# Patient Record
Sex: Female | Born: 1970 | Race: White | Hispanic: No | Marital: Single | Smoking: Never smoker
Health system: Southern US, Community
[De-identification: ages and names within clinical notes are randomized; demographics above are authoritative.]

## PROBLEM LIST (undated history)

## (undated) DIAGNOSIS — F411 Generalized anxiety disorder: Secondary | ICD-10-CM

## (undated) DIAGNOSIS — M1990 Unspecified osteoarthritis, unspecified site: Secondary | ICD-10-CM

## (undated) DIAGNOSIS — L0390 Cellulitis, unspecified: Secondary | ICD-10-CM

## (undated) DIAGNOSIS — T691XXA Chilblains, initial encounter: Secondary | ICD-10-CM

## (undated) DIAGNOSIS — F329 Major depressive disorder, single episode, unspecified: Secondary | ICD-10-CM

## (undated) DIAGNOSIS — F32A Depression, unspecified: Secondary | ICD-10-CM

## (undated) HISTORY — PX: AUGMENTATION MAMMAPLASTY: SUR837

## (undated) HISTORY — PX: TONSILECTOMY, ADENOIDECTOMY, BILATERAL MYRINGOTOMY AND TUBES: SHX2538

## (undated) HISTORY — DX: Generalized anxiety disorder: F41.1

## (undated) HISTORY — PX: COSMETIC SURGERY: SHX468

## (undated) HISTORY — DX: Cellulitis, unspecified: L03.90

## (undated) HISTORY — DX: Chilblains, initial encounter: T69.1XXA

## (undated) HISTORY — DX: Depression, unspecified: F32.A

## (undated) HISTORY — DX: Unspecified osteoarthritis, unspecified site: M19.90

## (undated) HISTORY — DX: Major depressive disorder, single episode, unspecified: F32.9

---

## 2000-05-16 ENCOUNTER — Ambulatory Visit (HOSPITAL_COMMUNITY): Admission: RE | Admit: 2000-05-16 | Discharge: 2000-05-16 | Payer: Self-pay

## 2000-07-01 HISTORY — PX: BREAST ENHANCEMENT SURGERY: SHX7

## 2006-03-07 ENCOUNTER — Ambulatory Visit: Payer: Self-pay | Admitting: Internal Medicine

## 2006-09-25 ENCOUNTER — Ambulatory Visit: Payer: Self-pay | Admitting: Chiropractor

## 2006-10-31 ENCOUNTER — Ambulatory Visit: Payer: Self-pay | Admitting: Chiropractor

## 2008-04-13 ENCOUNTER — Ambulatory Visit: Payer: Self-pay | Admitting: Chiropractic Medicine

## 2008-05-06 ENCOUNTER — Ambulatory Visit: Payer: Self-pay | Admitting: Family Medicine

## 2008-05-06 DIAGNOSIS — M25559 Pain in unspecified hip: Secondary | ICD-10-CM | POA: Insufficient documentation

## 2008-05-06 DIAGNOSIS — M779 Enthesopathy, unspecified: Secondary | ICD-10-CM | POA: Insufficient documentation

## 2008-06-10 ENCOUNTER — Ambulatory Visit: Payer: Self-pay | Admitting: Family Medicine

## 2008-07-04 ENCOUNTER — Ambulatory Visit: Payer: Self-pay | Admitting: Internal Medicine

## 2008-08-05 ENCOUNTER — Ambulatory Visit: Payer: Self-pay | Admitting: Unknown Physician Specialty

## 2008-09-29 ENCOUNTER — Ambulatory Visit: Payer: Self-pay | Admitting: Internal Medicine

## 2009-01-29 ENCOUNTER — Ambulatory Visit: Payer: Self-pay | Admitting: Internal Medicine

## 2009-02-24 ENCOUNTER — Ambulatory Visit: Payer: Self-pay | Admitting: Internal Medicine

## 2009-03-01 ENCOUNTER — Ambulatory Visit: Payer: Self-pay | Admitting: Internal Medicine

## 2009-03-02 ENCOUNTER — Ambulatory Visit: Payer: Self-pay | Admitting: Sports Medicine

## 2009-03-31 ENCOUNTER — Ambulatory Visit: Payer: Self-pay | Admitting: Internal Medicine

## 2009-05-01 ENCOUNTER — Ambulatory Visit: Payer: Self-pay | Admitting: Internal Medicine

## 2009-12-27 ENCOUNTER — Ambulatory Visit: Payer: Self-pay | Admitting: Unknown Physician Specialty

## 2010-03-16 ENCOUNTER — Ambulatory Visit: Payer: Self-pay | Admitting: Unknown Physician Specialty

## 2010-03-19 LAB — PATHOLOGY REPORT

## 2010-07-01 LAB — HM COLONOSCOPY

## 2010-09-25 ENCOUNTER — Encounter: Payer: Self-pay | Admitting: Cardiothoracic Surgery

## 2010-09-30 ENCOUNTER — Encounter: Payer: Self-pay | Admitting: Nurse Practitioner

## 2010-09-30 ENCOUNTER — Encounter: Payer: Self-pay | Admitting: Cardiothoracic Surgery

## 2010-10-30 ENCOUNTER — Encounter: Payer: Self-pay | Admitting: Nurse Practitioner

## 2010-10-30 ENCOUNTER — Encounter: Payer: Self-pay | Admitting: Cardiothoracic Surgery

## 2010-11-30 ENCOUNTER — Encounter: Payer: Self-pay | Admitting: Cardiothoracic Surgery

## 2010-11-30 ENCOUNTER — Encounter: Payer: Self-pay | Admitting: Nurse Practitioner

## 2010-12-31 ENCOUNTER — Encounter: Payer: Self-pay | Admitting: Infectious Diseases

## 2010-12-31 ENCOUNTER — Ambulatory Visit (INDEPENDENT_AMBULATORY_CARE_PROVIDER_SITE_OTHER): Payer: PRIVATE HEALTH INSURANCE | Admitting: Infectious Diseases

## 2010-12-31 VITALS — BP 131/86 | HR 89 | Temp 98.4°F | Ht 66.0 in | Wt 135.0 lb

## 2010-12-31 DIAGNOSIS — L0390 Cellulitis, unspecified: Secondary | ICD-10-CM

## 2010-12-31 DIAGNOSIS — L0291 Cutaneous abscess, unspecified: Secondary | ICD-10-CM

## 2010-12-31 MED ORDER — DOXYCYCLINE HYCLATE 100 MG PO TABS: 100.0000 mg | ORAL_TABLET | Freq: Two times a day (BID) | ORAL | Status: AC

## 2010-12-31 NOTE — Assessment & Plan Note (Addendum)
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 Will get a MRI to further eval this. Will start her on doxy at this point and see what her images show. 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 Will have her seen by another rheumatologist (she expresses conflict with seeing Dr Kellie Simmering). Will defer ordering ANA, ANCA, ACE until they see her.  Will see her back in 2 weeks.

## 2010-12-31 NOTE — Progress Notes (Signed)
  Subjective:    Patient ID: Mary Mason, female    DOB: 1971/03/22, 40 y.o.   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 She had previous cortisone shot in her R foot November 2011. This progressed and she developed a deepening hole in her foot. She was seen at wound care center in March [facility]) and was in a boot for sometime. She then developed worsening pain and had a punch done (caused wound to increase in size). She was told she had staph in this bx and she was given cipro for 10 days. This did not improve and she received 10 more days. She was seen at [facility] (~2 weeks ago) and was told that there was no bone infection (plain x-ray). She had ABI nl and dopplers of her leg that were negative as well.  She had a wound/swab Cx then and she followed up with podiatry. She was then changed to amoxil then for 7 days (which she will complete tomorrow) and was told she had a staph infection as well.  She has alos been w/u by derm/immuno at [facility] and was told that her auto-immune tests were negative.  She has a purplish discoloration in R foot and swelling, this worsens with leaving her foot dependent.  Has also developed 6 fingernails which have come off her nail beds.  No fevers or chills (ever during this process). No increase in heat in her foot (more likely to be cold). Has minimal yellowish d/c from her wound. No proximal erythema extending up her leg and calf.  Last night she also noted erythema in her L leg as well.    Review of Systems  Constitutional: Negative for fever and chills.  Hematological: Negative for adenopathy.  She has no hx of trauma to her foot (bite, puncture, ect)     Objective:   Physical Exam  Constitutional: She appears well-developed and well-nourished.  Eyes: EOM are normal. Pupils are equal, round, and reactive to light.  Neck: Neck supple.  Cardiovascular: Normal rate, regular rhythm and normal heart sounds.   Pulses:  Dorsalis pedis pulses are 3+ on the right side.       Posterior tibial pulses are 3+ on the right side.  Pulmonary/Chest: Effort normal and breath sounds normal.  Abdominal: Soft. Bowel sounds are normal. She exhibits no distension. There is no tenderness.  Lymphadenopathy:    She has no cervical adenopathy.  Skin:             Assessment & Plan:

## 2011-01-04 NOTE — Progress Notes (Signed)
Addended by: Jennet Maduro D on: 01/04/2011 04:41 PM   Modules accepted: Orders

## 2011-01-07 ENCOUNTER — Inpatient Hospital Stay (HOSPITAL_COMMUNITY): Admission: RE | Admit: 2011-01-07 | Payer: PRIVATE HEALTH INSURANCE | Source: Ambulatory Visit

## 2011-01-07 ENCOUNTER — Ambulatory Visit (HOSPITAL_COMMUNITY)
Admission: RE | Admit: 2011-01-07 | Discharge: 2011-01-07 | Disposition: A | Discharge status: Home / Self Care | Payer: PRIVATE HEALTH INSURANCE | Source: Ambulatory Visit | Attending: Infectious Diseases | Admitting: Infectious Diseases

## 2011-01-07 DIAGNOSIS — L02619 Cutaneous abscess of unspecified foot: Secondary | ICD-10-CM | POA: Insufficient documentation

## 2011-01-07 DIAGNOSIS — L0390 Cellulitis, unspecified: Secondary | ICD-10-CM

## 2011-01-07 DIAGNOSIS — R9389 Abnormal findings on diagnostic imaging of other specified body structures: Secondary | ICD-10-CM | POA: Insufficient documentation

## 2011-01-07 MED ORDER — GADOBENATE DIMEGLUMINE 529 MG/ML IV SOLN
13.0000 mL | Freq: Once | INTRAVENOUS | Status: AC
  Administered 2011-01-07: 13 mL via INTRAVENOUS

## 2011-01-08 ENCOUNTER — Telehealth: Payer: Self-pay | Admitting: *Deleted

## 2011-01-08 NOTE — Telephone Encounter (Signed)
Patient calling regarding MRI results.  Told her Dr. Ninetta Lights will look at tomorrow and I will call her back in the afternoon. Wendall Mola CMA

## 2011-01-10 NOTE — Telephone Encounter (Signed)
Per Dr. Ninetta Lights he will call patient about MRI results. Wendall Mola CMA

## 2011-05-15 DIAGNOSIS — T691XXA Chilblains, initial encounter: Secondary | ICD-10-CM | POA: Insufficient documentation

## 2011-06-11 DIAGNOSIS — R5383 Other fatigue: Secondary | ICD-10-CM | POA: Insufficient documentation

## 2011-06-11 DIAGNOSIS — Z87440 Personal history of urinary (tract) infections: Secondary | ICD-10-CM | POA: Insufficient documentation

## 2011-06-14 DIAGNOSIS — N939 Abnormal uterine and vaginal bleeding, unspecified: Secondary | ICD-10-CM | POA: Insufficient documentation

## 2012-02-13 DIAGNOSIS — B9689 Other specified bacterial agents as the cause of diseases classified elsewhere: Secondary | ICD-10-CM | POA: Insufficient documentation

## 2012-02-13 DIAGNOSIS — N760 Acute vaginitis: Secondary | ICD-10-CM | POA: Insufficient documentation

## 2012-06-16 DIAGNOSIS — R87619 Unspecified abnormal cytological findings in specimens from cervix uteri: Secondary | ICD-10-CM | POA: Insufficient documentation

## 2012-06-16 DIAGNOSIS — Z9882 Breast implant status: Secondary | ICD-10-CM | POA: Insufficient documentation

## 2012-07-18 ENCOUNTER — Emergency Department: Payer: Self-pay | Admitting: Emergency Medicine

## 2014-01-10 ENCOUNTER — Encounter: Payer: Self-pay | Admitting: Sports Medicine

## 2014-01-10 ENCOUNTER — Ambulatory Visit (INDEPENDENT_AMBULATORY_CARE_PROVIDER_SITE_OTHER): Payer: BC Managed Care – PPO | Admitting: Sports Medicine

## 2014-01-10 VITALS — BP 130/83 | HR 76 | Ht 66.0 in | Wt 135.0 lb

## 2014-01-10 DIAGNOSIS — S76311A Strain of muscle, fascia and tendon of the posterior muscle group at thigh level, right thigh, initial encounter: Secondary | ICD-10-CM

## 2014-01-10 DIAGNOSIS — IMO0002 Reserved for concepts with insufficient information to code with codable children: Secondary | ICD-10-CM

## 2014-01-10 NOTE — Progress Notes (Signed)
   Subjective:    Patient ID: [name], female    DOB: [birth year], 43 y.o.   MRN: [id number]  HPI chief complaint: Right hamstring pain  Very pleasant 43 year old female comes in today complaining of several years of intermittent right hamstring pain. She initially began to experience pain back in 2010. Pain was localized to her hamstring at that time. She does not recall a specific injury. She was treated with some home exercises which helped temporarily. Her discomfort is present primarily with running, although she does state that she does have times where she is able to run relatively pain-free. She describes the discomfort as a "tightening" in her hamstring. It is present with hamstring stretching. She most recently began to develop some lateral hip pain and some low back pain as well. She has had some chiropractic treatment and some physical therapy but nothing seems to help. Despite her discomfort she is able to remain active. She denies any deep-seated groin pain. No numbness or tingling. No pain past the knee but pain will occasionally radiate up into the buttock and low back.. No imaging to date. No prior low back or hip surgeries. She has custom orthotics which have been designed by a podiatrist in [place].    Review of Systems    as above Objective:   Physical Exam Well-developed, fit-appearing. No acute distress. Awake alert and oriented x3. Vital signs reviewed   Good lumbar range of motion although she does have reproducible right hamstring pain with forward flexion. No tenderness to palpation or percussion along the lumbar midline. There is tenderness to palpation just posterior to the greater trochanter on the right and a mildly positive Trendelenburg. No tenderness at the sciatic notch. Smooth painless hip range of motion with a negative log roll. Negative straight leg raise. There is no palpable defect or soft tissue swelling in the hamstring muscles. Neg FABER, Neg  FADIR, negative Thomas test. No leg length discrepancy noted. Neurovascularly intact distally.  Patient is an extreme supinator when running. Flexible pes cavus foot.       Assessment & Plan:  Chronic right hamstring strain Gluteus medius weakness Supination  This is a challenging case. I'm going to start with giving her some gluteus medius strengthening exercises and some hamstring eccentric exercises. I examined her custom orthotics today and although they are comfortable and well cushioned, I do not think they provide enough arch support and they definitely do not correct her supination. I discussed the possibility of semirigid orthotics in the future. I've asked her to followup with me in a few weeks. If no initial improvement with her home exercises I may consider plain x-rays of her low back and pelvis. I think she is okay to continue with all activity in the interim including running.  Total time spent with the patient today was 30 minutes with 100% of the time in face-to-face consultation.

## 2014-02-23 ENCOUNTER — Ambulatory Visit: Payer: BC Managed Care – PPO | Admitting: Sports Medicine

## 2014-03-14 ENCOUNTER — Ambulatory Visit (INDEPENDENT_AMBULATORY_CARE_PROVIDER_SITE_OTHER): Payer: BC Managed Care – PPO | Admitting: Sports Medicine

## 2014-03-14 ENCOUNTER — Encounter: Payer: Self-pay | Admitting: Sports Medicine

## 2014-03-14 VITALS — BP 126/81 | HR 72 | Ht 66.0 in | Wt 137.0 lb

## 2014-03-14 DIAGNOSIS — S76311D Strain of muscle, fascia and tendon of the posterior muscle group at thigh level, right thigh, subsequent encounter: Secondary | ICD-10-CM

## 2014-03-14 DIAGNOSIS — M25559 Pain in unspecified hip: Secondary | ICD-10-CM

## 2014-03-14 DIAGNOSIS — Z5189 Encounter for other specified aftercare: Secondary | ICD-10-CM | POA: Diagnosis not present

## 2014-03-14 DIAGNOSIS — IMO0002 Reserved for concepts with insufficient information to code with codable children: Secondary | ICD-10-CM | POA: Diagnosis not present

## 2014-03-14 DIAGNOSIS — S76311A Strain of muscle, fascia and tendon of the posterior muscle group at thigh level, right thigh, initial encounter: Secondary | ICD-10-CM

## 2014-03-15 NOTE — Progress Notes (Signed)
   Subjective:    Patient ID: Mary Mason, female    DOB: 04-19-1971, 43 y.o.   MRN: [id number]  HPI Patient comes in today for followup on chronic right hamstring pain. She has been doing her home exercises for her gluteus medius but admits that she has been a little less faithful about doing her hamstring exercises. She continues to complain of a tightness in her right hamstring with running. Pain would not radiate past the knee. No associated numbness or tingling. No groin pain. Symptoms have been present now for several years. She denies any low back pain.    Review of Systems     Objective:   Physical Exam Well-developed, well-nourished. No acute distress. Lumbar spine demonstrates full painless range of motion. She has excellent hip strength including improved hip abduction strength. Negative straight leg raise bilaterally. There is some slight tenderness to palpation in the mid substance of the hamstring muscles but no palpable defect. No tenderness to palpation at the ischial tuberosity. Neurovascular she is intact distally. Flexible pes cavus foot. She supinates when walking and running.       Assessment & Plan:  Chronic right hamstring pain Supination  As noted previously, this is a rather challenging case. I had previously discussed the possibility of lumbar spine x-rays if her symptoms persisted but I really do not believe her symptoms are originating from her lumbar spine. We're going to try replacing her old orthotics with a pair of green sports insoles with scaphoid pads and lateral heel wedges in order to correct her supination. I'm not sure if that will make much of a difference but I'll have her followup with my partner Dr. Oneida Alar in about 4 weeks for his input. I suspect there is something subtle in her running form that his leg to this chronic right hamstring discomfort.

## 2014-04-08 ENCOUNTER — Encounter: Payer: Self-pay | Admitting: Podiatry

## 2014-04-08 ENCOUNTER — Ambulatory Visit (INDEPENDENT_AMBULATORY_CARE_PROVIDER_SITE_OTHER): Payer: BC Managed Care – PPO | Admitting: Podiatry

## 2014-04-08 VITALS — BP 117/69 | HR 69 | Resp 16 | Ht 66.0 in | Wt 138.0 lb

## 2014-04-08 DIAGNOSIS — L600 Ingrowing nail: Secondary | ICD-10-CM

## 2014-04-08 DIAGNOSIS — M79609 Pain in unspecified limb: Secondary | ICD-10-CM

## 2014-04-08 NOTE — Progress Notes (Signed)
Subjective:     Patient ID: Mary Mason, female   DOB: 10/26/1970, 43 y.o.   MRN: [id number]  HPI patient states I developed an ingrown toenail on my right big toenail that makes it hard for me to wear shoe gear comfortably. I've not noticed any drainage   Review of Systems     Objective:   Physical Exam Neurovascular status intact with an incurvated right hallux lateral border that's painful when palpated    Assessment:     Probable ingrown toenail right hallux lateral    Plan:     Reviewed ingrown toenail and discussed treatment options. Patient wants this corrected and I think would be in her best option. I explained risk of procedure and today I infiltrated the right hallux 60 mg Xylocaine Marcaine mixture remove the lateral border exposed matrix and applied phenol 3 applications 30 seconds followed by alcohol lavaged and sterile dressing. Gave instructions on soaks and reappoint

## 2014-04-08 NOTE — Patient Instructions (Signed)

## 2014-04-19 ENCOUNTER — Ambulatory Visit (INDEPENDENT_AMBULATORY_CARE_PROVIDER_SITE_OTHER): Payer: BC Managed Care – PPO | Admitting: Podiatry

## 2014-04-19 ENCOUNTER — Encounter: Payer: Self-pay | Admitting: Podiatry

## 2014-04-19 ENCOUNTER — Ambulatory Visit: Payer: BC Managed Care – PPO | Admitting: Sports Medicine

## 2014-04-19 VITALS — BP 116/69 | HR 69 | Resp 16

## 2014-04-19 DIAGNOSIS — L03031 Cellulitis of right toe: Secondary | ICD-10-CM

## 2014-04-19 MED ORDER — CEPHALEXIN 500 MG PO CAPS: 500.0000 mg | ORAL_CAPSULE | Freq: Three times a day (TID) | ORAL | Status: DC

## 2014-04-19 NOTE — Progress Notes (Signed)
Subjective:     Patient ID: Mary Mason, female   DOB: [birth year], 43 y.o.   MRN: [id number]  HPI patient presents stating I have some discomfort in the border of my big toenail and I wanted to make sure it doesn't get infected   Review of Systems     Objective:   Physical Exam Neurovascular status intact with no changes in health history. Patient is noted to have mild erythema in the lateral aspect right hallux nail border that's localized with crusted nail formation and no proximal edema erythema or drainage noted    Assessment:     Localized paronychia right hallux    Plan:     I went ahead today and placed her on cephalexin 500 mg 3 times a day and gave instructions of any increase in redness drainage or pain should occur we will need to go in and opened up the border of the nailbed. Continue soaks and reappoint as needed

## 2015-02-06 ENCOUNTER — Encounter (INDEPENDENT_AMBULATORY_CARE_PROVIDER_SITE_OTHER): Payer: Self-pay

## 2015-02-06 ENCOUNTER — Ambulatory Visit (INDEPENDENT_AMBULATORY_CARE_PROVIDER_SITE_OTHER): Payer: BLUE CROSS/BLUE SHIELD | Admitting: Sports Medicine

## 2015-02-06 ENCOUNTER — Encounter: Payer: Self-pay | Admitting: Sports Medicine

## 2015-02-06 VITALS — BP 119/80 | HR 62 | Ht 66.0 in | Wt 140.0 lb

## 2015-02-06 DIAGNOSIS — M216X9 Other acquired deformities of unspecified foot: Secondary | ICD-10-CM | POA: Diagnosis not present

## 2015-02-06 NOTE — Progress Notes (Signed)
   Subjective:    Patient ID: Mary Mason, female    DOB: [birth year], 44 y.o.   MRN: [id number]  HPI  Patient comes in today for custom orthotics. She was last seen in the office back in September of last year. We had discussed making custom orthotics at that time. She has an old pair of orthotics that were constructed at [facility] and she has found them to be comfortable but they are rather expensive and it is inconvenient for her to return to [facility] to have a new pair made. At the time of her last office visit I placed her in green sports insoles with scaphoid pads and a lateral heel wedge to help correct supination. She found these to be uncomfortable and has since stopped using them. She would like for [name] to construct her new custom orthotics today.  Interim medical history reviewed Medications reviewed Allergies reviewed    Review of Systems    as above Objective:   Physical Exam Well-developed, fit appearing. No acute distress. Awake alert and oriented 3. Vital signs reviewed.  Flexible cavus foot. No tenderness to palpation. Patient does supinate with running. Runs without a limp.       Assessment & Plan:  Cavus foot  I inspected her old custom orthotics from [facility]. They are a simple cushioned orthotic. Therefore I will construct her a basic orthotic today. I will not try to post it or correct her supination since she did not get much relief from lateral heel wedges. If she finds the orthotics to be uncomfortable she may return at any time to have them adjusted. Otherwise I'll see her back as needed.  Total time spent with the patient was 30 minutes with greater than 50% of the time spent in face-to-face consultation discussing orthotic construction, instruction, and fitting.  Patient was fitted for a : standard, cushioned, semi-rigid orthotic. The orthotic was heated and afterward the patient stood on the orthotic blank positioned on the orthotic stand. The patient was  positioned in subtalar neutral position and 10 degrees of ankle dorsiflexion in a weight bearing stance. After completion of molding, a stable base was applied to the orthotic blank. The blank was ground to a stable position for weight bearing. Size: 6 Base: blue EVA Posting: none Additional orthotic padding: none

## 2015-10-11 ENCOUNTER — Ambulatory Visit: Payer: BLUE CROSS/BLUE SHIELD | Admitting: Nurse Practitioner

## 2015-10-23 ENCOUNTER — Encounter: Payer: Self-pay | Admitting: Nurse Practitioner

## 2015-10-23 ENCOUNTER — Ambulatory Visit (INDEPENDENT_AMBULATORY_CARE_PROVIDER_SITE_OTHER): Payer: BLUE CROSS/BLUE SHIELD | Admitting: Nurse Practitioner

## 2015-10-23 VITALS — BP 96/62 | HR 58 | Temp 98.2°F | Resp 14 | Ht 66.0 in | Wt 137.2 lb

## 2015-10-23 DIAGNOSIS — M359 Systemic involvement of connective tissue, unspecified: Secondary | ICD-10-CM | POA: Diagnosis not present

## 2015-10-23 DIAGNOSIS — M2550 Pain in unspecified joint: Secondary | ICD-10-CM | POA: Diagnosis not present

## 2015-10-23 DIAGNOSIS — D8989 Other specified disorders involving the immune mechanism, not elsewhere classified: Secondary | ICD-10-CM

## 2015-10-23 DIAGNOSIS — T691XXD Chilblains, subsequent encounter: Secondary | ICD-10-CM

## 2015-10-23 DIAGNOSIS — Z0000 Encounter for general adult medical examination without abnormal findings: Secondary | ICD-10-CM | POA: Diagnosis not present

## 2015-10-23 DIAGNOSIS — Z0001 Encounter for general adult medical examination with abnormal findings: Secondary | ICD-10-CM | POA: Insufficient documentation

## 2015-10-23 NOTE — Progress Notes (Signed)
Patient ID: Mary Mason, female    DOB: May 09, 1971  Age: 45 y.o. MRN: [id number]  CC: Establish Care   HPI SONATA HOCKLEY presents for establishing care and no CC. Will do Annual Physical since there is no need for PAP nor Breast exam- sees GYN.   1) New Pt Info:   Immunizations- Unknown   Mammogram- Implants, 2 yrs ago, getting one soon  Pap- Has an appointment w/ Dr. 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 Vira Agar for GI  Dr. Jerene Pitch for Rheumatology- [facility]  [place] Dr. Dellis Filbert for GYN Dr. Paulla Dolly- Podiatrist Dr. Miche[facility] Dr. Johnnye Sima- Infectious disease due to cellulitis  [facility]    History [name] has a past medical history of Chilblains; Depression; and Arthritis.   She has past surgical history that includes Tonsilectomy, adenoidectomy, bilateral myringotomy and tubes and Breast enhancement surgery (2002).   Her family history includes Cancer in her maternal grandmother; Cancer (age of onset: 59) in her maternal aunt; Hearing loss in her maternal grandmother; Thyroid nodules in her mother.She reports that she has never smoked. She has never used smokeless tobacco. She reports that she drinks alcohol. She reports that she does not use illicit drugs.  Outpatient Prescriptions Prior to Visit  Medication Sig Dispense Refill  . aspirin 81 MG chewable tablet Chew by mouth.    . Multiple Vitamins-Minerals (MULTIVITAMIN WITH MINERALS) tablet Take 1 tablet by mouth daily.      . sertraline (ZOLOFT) 100 MG tablet     . Ascorbic Acid (VITAMIN C) 100 MG tablet Take 100 mg by mouth daily. Reported on 10/23/2015    . Levonorgestrel-Ethinyl Estradiol (SEASONIQUE) 0.15-0.03 &0.01 MG tablet Take by mouth.    Marland Kitchen  omeprazole (PRILOSEC) 20 MG capsule Take 20 mg by mouth daily. Reported on 10/23/2015    . cephALEXin (KEFLEX) 500 MG capsule Take 1 capsule (500 mg total) by mouth 3 (three) times daily. (Patient not taking: Reported on 02/06/2015) 30 capsule 1  . clopidogrel (PLAVIX) 75 MG tablet Take by mouth.    . hydroxychloroquine (PLAQUENIL) 200 MG tablet Take by mouth.    . prednisoLONE acetate (PRED FORTE) 1 % ophthalmic suspension instill 2 drops into both eyes four times a day  0   No facility-administered medications prior to visit.    ROS Review of Systems  Constitutional: Negative for fever, chills, diaphoresis, fatigue and unexpected weight change.  HENT: Negative for tinnitus and trouble swallowing.   Eyes: Negative for visual disturbance.  Respiratory: Negative for chest tightness, shortness of breath and wheezing.   Cardiovascular: Negative for chest pain, palpitations and leg swelling.  Gastrointestinal: Negative for nausea, vomiting, abdominal pain, diarrhea, constipation and blood in stool.  Endocrine: Negative for polydipsia, polyphagia and polyuria.  Genitourinary: Negative for dysuria, hematuria, vaginal discharge and vaginal pain.  Musculoskeletal: Positive for arthralgias. Negative for myalgias, back pain and gait problem.  Skin: Negative for color change and rash.  Neurological: Negative for dizziness, weakness, numbness and headaches.  Hematological: Does not bruise/bleed easily.  Psychiatric/Behavioral: Negative for suicidal ideas and sleep disturbance. The patient is not nervous/anxious.     Objective:  BP 96/62 mmHg  Pulse 58  Temp(Src)  98.2 F (36.8 C) (Oral)  Resp 14  Ht 5\' 6"  (1.676 m)  Wt 137 lb 3.2 oz (62.234 kg)  BMI 22.16 kg/m2  SpO2 99%  Physical Exam  Constitutional: She is oriented to person, place, and time. She appears well-developed and well-nourished. No distress.  HENT:  Head: Normocephalic and atraumatic.  Right Ear: External ear normal.  Left Ear:  External ear normal.  Nose: Nose normal.  Mouth/Throat: Oropharynx is clear and moist. No oropharyngeal exudate.  TMs and canals clear bilaterally  Eyes: Conjunctivae and EOM are normal. Pupils are equal, round, and reactive to light. Right eye exhibits no discharge. Left eye exhibits no discharge. No scleral icterus.  Neck: Normal range of motion. Neck supple. No thyromegaly present.  Cardiovascular: Normal rate, regular rhythm, normal heart sounds and intact distal pulses.  Exam reveals no gallop and no friction rub.   No murmur heard. Pulmonary/Chest: Effort normal and breath sounds normal. No respiratory distress. She has no wheezes. She has no rales. She exhibits no tenderness.  Deferred breast exam to GYN  Abdominal: Soft. Bowel sounds are normal. She exhibits no distension and no mass. There is no tenderness. There is no rebound and no guarding.  Genitourinary:  Deferred to GYN  Musculoskeletal: Normal range of motion. She exhibits no edema or tenderness.  Lymphadenopathy:    She has no cervical adenopathy.  Neurological: She is alert and oriented to person, place, and time. She has normal reflexes. No cranial nerve deficit. She exhibits normal muscle tone. Coordination normal.  Skin: Skin is warm and dry. No rash noted. She is not diaphoretic. No erythema. No pallor.  Psychiatric: She has a normal mood and affect. Her behavior is normal. Judgment and thought content normal.   Assessment & Plan:   Tulani was seen today for establish care.  Diagnoses and all orders for this visit:  Routine general medical examination at a health care facility -     CBC with Differential/Platelet; Future -     Comprehensive metabolic panel; Future -     Hemoglobin A1c; Future -     Lipid panel; Future -     TSH; Future  Autoimmune disorder (HCC) -     Rheumatoid factor; Future -     ANA; Future -     Sedimentation rate; Future -     C-reactive protein; Future -     Cyclic Citrul Peptide  Antibody, IGG; Future  Pain, joint, multiple sites -     Rheumatoid factor; Future -     ANA; Future -     Sedimentation rate; Future -     C-reactive protein; Future -     Cyclic Citrul Peptide Antibody, IGG; Future  Chilblain lupus, subsequent encounter   I have discontinued Ms. Mcginley's clopidogrel, hydroxychloroquine, cephALEXin, and prednisoLONE acetate. I am also having her maintain her omeprazole, multivitamin with minerals, vitamin C, aspirin, Levonorgestrel-Ethinyl Estradiol, and sertraline.  No orders of the defined types were placed in this encounter.     Follow-up: Return if symptoms worsen or fail to improve.

## 2015-10-23 NOTE — Assessment & Plan Note (Addendum)
Discussed acute and chronic issues. Reviewed health maintenance measures, PFSHx, and immunizations. Obtain routine labs TSH, Lipid panel, CBC w/ diff, A1c, and CMET.  CPE today due to no chief complaints  Mammo and  PAP soon w/ GYN

## 2015-10-23 NOTE — Assessment & Plan Note (Signed)
Pt has autoimmune condition/genetic condition  Pain of multiple small joints- fingers, toes, and elbows bilaterally  Checking Rheum panel

## 2015-10-23 NOTE — Patient Instructions (Addendum)
Welcome to [facility]! Nice to meet you.   See you when you need me!  Health Maintenance, Female Adopting a healthy lifestyle and getting preventive care can go a long way to promote health and wellness. Talk with your health care provider about what schedule of regular examinations is right for you. This is a good chance for you to check in with your provider about disease prevention and staying healthy. In between checkups, there are plenty of things you can do on your own. Experts have done a lot of research about which lifestyle changes and preventive measures are most likely to keep you healthy. Ask your health care provider for more information. WEIGHT AND DIET  Eat a healthy diet  Be sure to include plenty of vegetables, fruits, low-fat dairy products, and lean protein.  Do not eat a lot of foods high in solid fats, added sugars, or salt.  Get regular exercise. This is one of the most important things you can do for your health.  Most adults should exercise for at least 150 minutes each week. The exercise should increase your heart rate and make you sweat (moderate-intensity exercise).  Most adults should also do strengthening exercises at least twice a week. This is in addition to the moderate-intensity exercise.  Maintain a healthy weight  Body mass index (BMI) is a measurement that can be used to identify possible weight problems. It estimates body fat based on height and weight. Your health care provider can help determine your BMI and help you achieve or maintain a healthy weight.  For females 44 years of age and older:   A BMI below 18.5 is considered underweight.  A BMI of 18.5 to 24.9 is normal.  A BMI of 25 to 29.9 is considered overweight.  A BMI of 30 and above is considered obese.  Watch levels of cholesterol and blood lipids  You should start having your blood tested for lipids and cholesterol at 45 years of age, then have this test every 5 years.  You may need  to have your cholesterol levels checked more often if:  Your lipid or cholesterol levels are high.  You are older than 45 years of age.  You are at high risk for heart disease.  CANCER SCREENING   Lung Cancer  Lung cancer screening is recommended for adults 37-63 years old who are at high risk for lung cancer because of a history of smoking.  A yearly low-dose CT scan of the lungs is recommended for people who:  Currently smoke.  Have quit within the past 15 years.  Have at least a 30-pack-year history of smoking. A pack year is smoking an average of one pack of cigarettes a day for 1 year.  Yearly screening should continue until it has been 15 years since you quit.  Yearly screening should stop if you develop a health problem that would prevent you from having lung cancer treatment.  Breast Cancer  Practice breast self-awareness. This means understanding how your breasts normally appear and feel.  It also means doing regular breast self-exams. Let your health care provider know about any changes, no matter how small.  If you are in your 20s or 30s, you should have a clinical breast exam (CBE) by a health care provider every 1-3 years as part of a regular health exam.  If you are 91 or older, have a CBE every year. Also consider having a breast X-ray (mammogram) every year.  If you have a family  history of breast cancer, talk to your health care provider about genetic screening.  If you are at high risk for breast cancer, talk to your health care provider about having an MRI and a mammogram every year.  Breast cancer gene (BRCA) assessment is recommended for women who have family members with BRCA-related cancers. BRCA-related cancers include:  Breast.  Ovarian.  Tubal.  Peritoneal cancers.  Results of the assessment will determine the need for genetic counseling and BRCA1 and BRCA2 testing. Cervical Cancer Your health care provider may recommend that you be  screened regularly for cancer of the pelvic organs (ovaries, uterus, and vagina). This screening involves a pelvic examination, including checking for microscopic changes to the surface of your cervix (Pap test). You may be encouraged to have this screening done every 3 years, beginning at age 38.  For women ages 83-65, health care providers may recommend pelvic exams and Pap testing every 3 years, or they may recommend the Pap and pelvic exam, combined with testing for human papilloma virus (HPV), every 5 years. Some types of HPV increase your risk of cervical cancer. Testing for HPV may also be done on women of any age with unclear Pap test results.  Other health care providers may not recommend any screening for nonpregnant women who are considered low risk for pelvic cancer and who do not have symptoms. Ask your health care provider if a screening pelvic exam is right for you.  If you have had past treatment for cervical cancer or a condition that could lead to cancer, you need Pap tests and screening for cancer for at least 20 years after your treatment. If Pap tests have been discontinued, your risk factors (such as having a new sexual partner) need to be reassessed to determine if screening should resume. Some women have medical problems that increase the chance of getting cervical cancer. In these cases, your health care provider may recommend more frequent screening and Pap tests. Colorectal Cancer  This type of cancer can be detected and often prevented.  Routine colorectal cancer screening usually begins at 45 years of age and continues through 45 years of age.  Your health care provider may recommend screening at an earlier age if you have risk factors for colon cancer.  Your health care provider may also recommend using home test kits to check for hidden blood in the stool.  A small camera at the end of a tube can be used to examine your colon directly (sigmoidoscopy or colonoscopy).  This is done to check for the earliest forms of colorectal cancer.  Routine screening usually begins at age 52.  Direct examination of the colon should be repeated every 5-10 years through 45 years of age. However, you may need to be screened more often if early forms of precancerous polyps or small growths are found. Skin Cancer  Check your skin from head to toe regularly.  Tell your health care provider about any new moles or changes in moles, especially if there is a change in a mole's shape or color.  Also tell your health care provider if you have a mole that is larger than the size of a pencil eraser.  Always use sunscreen. Apply sunscreen liberally and repeatedly throughout the day.  Protect yourself by wearing long sleeves, pants, a wide-brimmed hat, and sunglasses whenever you are outside. HEART DISEASE, DIABETES, AND HIGH BLOOD PRESSURE   High blood pressure causes heart disease and increases the risk of stroke. High blood pressure  is more likely to develop in:  People who have blood pressure in the high end of the normal range (130-139/85-89 mm Hg).  People who are overweight or obese.  People who are African American.  If you are 105-22 years of age, have your blood pressure checked every 3-5 years. If you are 70 years of age or older, have your blood pressure checked every year. You should have your blood pressure measured twice--once when you are at a hospital or clinic, and once when you are not at a hospital or clinic. Record the average of the two measurements. To check your blood pressure when you are not at a hospital or clinic, you can use:  An automated blood pressure machine at a pharmacy.  A home blood pressure monitor.  If you are between 41 years and 15 years old, ask your health care provider if you should take aspirin to prevent strokes.  Have regular diabetes screenings. This involves taking a blood sample to check your fasting blood sugar level.  If you  are at a normal weight and have a low risk for diabetes, have this test once every three years after 45 years of age.  If you are overweight and have a high risk for diabetes, consider being tested at a younger age or more often. PREVENTING INFECTION  Hepatitis B  If you have a higher risk for hepatitis B, you should be screened for this virus. You are considered at high risk for hepatitis B if:  You were born in a country where hepatitis B is common. Ask your health care provider which countries are considered high risk.  Your parents were born in a high-risk country, and you have not been immunized against hepatitis B (hepatitis B vaccine).  You have HIV or AIDS.  You use needles to inject street drugs.  You live with someone who has hepatitis B.  You have had sex with someone who has hepatitis B.  You get hemodialysis treatment.  You take certain medicines for conditions, including cancer, organ transplantation, and autoimmune conditions. Hepatitis C  Blood testing is recommended for:  Everyone born from [birth year] through 1965.  Anyone with known risk factors for hepatitis C. Sexually transmitted infections (STIs)  You should be screened for sexually transmitted infections (STIs) including gonorrhea and chlamydia if:  You are sexually active and are younger than 45 years of age.  You are older than 45 years of age and your health care provider tells you that you are at risk for this type of infection.  Your sexual activity has changed since you were last screened and you are at an increased risk for chlamydia or gonorrhea. Ask your health care provider if you are at risk.  If you do not have HIV, but are at risk, it may be recommended that you take a prescription medicine daily to prevent HIV infection. This is called pre-exposure prophylaxis (PrEP). You are considered at risk if:  You are sexually active and do not regularly use condoms or know the HIV status of your  partner(s).  You take drugs by injection.  You are sexually active with a partner who has HIV. Talk with your health care provider about whether you are at high risk of being infected with HIV. If you choose to begin PrEP, you should first be tested for HIV. You should then be tested every 3 months for as long as you are taking PrEP.  PREGNANCY   If you are premenopausal and  you may become pregnant, ask your health care provider about preconception counseling.  If you may become pregnant, take 400 to 800 micrograms (mcg) of folic acid every day.  If you want to prevent pregnancy, talk to your health care provider about birth control (contraception). OSTEOPOROSIS AND MENOPAUSE   Osteoporosis is a disease in which the bones lose minerals and strength with aging. This can result in serious bone fractures. Your risk for osteoporosis can be identified using a bone density scan.  If you are 53 years of age or older, or if you are at risk for osteoporosis and fractures, ask your health care provider if you should be screened.  Ask your health care provider whether you should take a calcium or vitamin D supplement to lower your risk for osteoporosis.  Menopause may have certain physical symptoms and risks.  Hormone replacement therapy may reduce some of these symptoms and risks. Talk to your health care provider about whether hormone replacement therapy is right for you.  HOME CARE INSTRUCTIONS   Schedule regular health, dental, and eye exams.  Stay current with your immunizations.   Do not use any tobacco products including cigarettes, chewing tobacco, or electronic cigarettes.  If you are pregnant, do not drink alcohol.  If you are breastfeeding, limit how much and how often you drink alcohol.  Limit alcohol intake to no more than 1 drink per day for nonpregnant women. One drink equals 12 ounces of beer, 5 ounces of wine, or 1 ounces of hard liquor.  Do not use street drugs.  Do  not share needles.  Ask your health care provider for help if you need support or information about quitting drugs.  Tell your health care provider if you often feel depressed.  Tell your health care provider if you have ever been abused or do not feel safe at home.   This information is not intended to replace advice given to you by your health care provider. Make sure you discuss any questions you have with your health care provider.   Document Released: 12/31/2010 Document Revised: 07/08/2014 Document Reviewed: 05/19/2013 Elsevier Interactive Patient Education [employer].

## 2015-10-23 NOTE — Assessment & Plan Note (Signed)
Stable currently Flares with cold wet weather  Has not seen Rheum at [facility] in 2 years Plaquenil and plavix helpful in past

## 2015-10-26 LAB — HM MAMMOGRAPHY

## 2015-11-03 ENCOUNTER — Other Ambulatory Visit (INDEPENDENT_AMBULATORY_CARE_PROVIDER_SITE_OTHER): Payer: BLUE CROSS/BLUE SHIELD

## 2015-11-03 DIAGNOSIS — M2550 Pain in unspecified joint: Secondary | ICD-10-CM

## 2015-11-03 DIAGNOSIS — D8989 Other specified disorders involving the immune mechanism, not elsewhere classified: Secondary | ICD-10-CM

## 2015-11-03 DIAGNOSIS — Z0000 Encounter for general adult medical examination without abnormal findings: Secondary | ICD-10-CM | POA: Diagnosis not present

## 2015-11-03 DIAGNOSIS — M359 Systemic involvement of connective tissue, unspecified: Secondary | ICD-10-CM

## 2015-11-03 LAB — HEMOGLOBIN A1C: HEMOGLOBIN A1C: 5.3 % (ref 4.6–6.5)

## 2015-11-03 LAB — CBC WITH DIFFERENTIAL/PLATELET
Basophils Absolute: 0 10*3/uL (ref 0.0–0.1)
Basophils Relative: 0.3 % (ref 0.0–3.0)
Eosinophils Absolute: 0.1 10*3/uL (ref 0.0–0.7)
Eosinophils Relative: 1.6 % (ref 0.0–5.0)
HCT: 39.1 % (ref 36.0–46.0)
Hemoglobin: 13.4 g/dL (ref 12.0–15.0)
Lymphocytes Relative: 18.9 % (ref 12.0–46.0)
Lymphs Abs: 1.2 10*3/uL (ref 0.7–4.0)
MCHC: 34.3 g/dL (ref 30.0–36.0)
MCV: 88.5 fl (ref 78.0–100.0)
Monocytes Absolute: 0.3 10*3/uL (ref 0.1–1.0)
Monocytes Relative: 4.9 % (ref 3.0–12.0)
Neutro Abs: 4.6 10*3/uL (ref 1.4–7.7)
Neutrophils Relative %: 74.3 % (ref 43.0–77.0)
Platelets: 291 10*3/uL (ref 150.0–400.0)
RBC: 4.42 Mil/uL (ref 3.87–5.11)
RDW: 14.5 % (ref 11.5–15.5)
WBC: 6.2 10*3/uL (ref 4.0–10.5)

## 2015-11-03 LAB — LIPID PANEL
Cholesterol: 155 mg/dL (ref 0–200)
HDL: 64.4 mg/dL (ref >39.00)
LDL Cholesterol: 75 mg/dL (ref 0–99)
NonHDL: 90.11
Total CHOL/HDL Ratio: 2
Triglycerides: 77 mg/dL (ref 0.0–149.0)
VLDL: 15.4 mg/dL (ref 0.0–40.0)

## 2015-11-03 LAB — COMPREHENSIVE METABOLIC PANEL
ALBUMIN: 4.2 g/dL (ref 3.5–5.2)
ALT: 20 U/L (ref 0–35)
AST: 27 U/L (ref 0–37)
Alkaline Phosphatase: 30 U/L — ABNORMAL LOW (ref 39–117)
BILIRUBIN TOTAL: 0.5 mg/dL (ref 0.2–1.2)
BUN: 17 mg/dL (ref 6–23)
CALCIUM: 9.3 mg/dL (ref 8.4–10.5)
CO2: 27 meq/L (ref 19–32)
Chloride: 102 mEq/L (ref 96–112)
Creatinine, Ser: 0.9 mg/dL (ref 0.40–1.20)
GFR: 72.04 mL/min (ref >60.00)
Glucose, Bld: 87 mg/dL (ref 70–99)
Potassium: 4.5 mEq/L (ref 3.5–5.1)
Sodium: 137 mEq/L (ref 135–145)
Total Protein: 6.7 g/dL (ref 6.0–8.3)

## 2015-11-03 LAB — TSH: TSH: 1.61 u[IU]/mL (ref 0.35–4.50)

## 2015-11-03 LAB — RHEUMATOID FACTOR: Rhuematoid fact SerPl-aCnc: <10 IU/mL (ref <=14)

## 2015-11-03 LAB — SEDIMENTATION RATE: Sed Rate: 7 mm/hr (ref 0–22)

## 2015-11-03 LAB — C-REACTIVE PROTEIN: CRP: 0.4 mg/dL — ABNORMAL LOW (ref 0.5–20.0)

## 2015-11-06 LAB — ANA: Anti Nuclear Antibody(ANA): NEGATIVE

## 2015-11-06 LAB — CYCLIC CITRUL PEPTIDE ANTIBODY, IGG: Cyclic Citrullin Peptide Ab: <16 Units

## 2015-12-28 ENCOUNTER — Ambulatory Visit: Payer: BLUE CROSS/BLUE SHIELD | Admitting: Family Medicine

## 2016-01-22 ENCOUNTER — Ambulatory Visit
Admission: RE | Admit: 2016-01-22 | Discharge: 2016-01-22 | Disposition: A | Discharge status: Home / Self Care | Payer: BLUE CROSS/BLUE SHIELD | Source: Ambulatory Visit | Attending: Sports Medicine | Admitting: Sports Medicine

## 2016-01-22 ENCOUNTER — Encounter: Payer: Self-pay | Admitting: Sports Medicine

## 2016-01-22 ENCOUNTER — Ambulatory Visit (INDEPENDENT_AMBULATORY_CARE_PROVIDER_SITE_OTHER): Payer: BLUE CROSS/BLUE SHIELD | Admitting: Sports Medicine

## 2016-01-22 VITALS — BP 121/92 | HR 60 | Ht 66.0 in | Wt 135.0 lb

## 2016-01-22 DIAGNOSIS — M25531 Pain in right wrist: Secondary | ICD-10-CM | POA: Diagnosis not present

## 2016-01-22 NOTE — Progress Notes (Signed)
   Subjective:    Patient ID: Mary Mason, female    DOB: [birth year], 45 y.o.   MRN: [id number]  HPI chief complaint: Right wrist pain  Patient is a 45 year old right-hand-dominant female that comes in today complaining of 4 months of right wrist pain. Pain began after she fell off of an exercise ball and landed on an outstretched hand. She had immediate pain across the dorsum of her wrist but did not notice any swelling or ecchymosis. Since that time she has had pain with dorsi flexion of the wrist. She notes that if she holds the wrist in a neutral position then she is fine but activity such as pushups or pushing open a door causes significant pain. She is able to localize her pain to the middle portion of the dorsum of her wrist. She does get occasional catching and popping in the wrist. No numbness or tingling she denies any problems with this wrist in the past but she did suffer a fourth metacarpal fracture in this same hand a couple of years ago which was treated conservatively without surgery. She denies any pain more proximally at the elbow. She has purchased a compression sleeve for her wrist but it has not been very effective. She has not taken any sort of over-the-counter pain medication.  Interim medical history reviewed Medications reviewed Allergies reviewed    Review of Systems    as above Objective:   Physical Exam  Well-developed, fit appearing. No acute distress. Awake alert and oriented 3. Vital signs reviewed  Right wrist: Patient lacks about 5 of dorsiflexion when compared to the uninvolved left wrist. She has full flexion, ulnar deviation, and radial deviation. No soft tissue swelling. No effusion. There is no tenderness to palpation in the anatomic snuff box. There is tenderness to palpation directly over the area of the scapholunate ligament. Negative Tinel's, negative Phalen's. No tenderness to palpation across the dorsum of the hand. No tenderness over the TFCC.  Good grip strength. Good radial ulnar pulses. Sensation is intact to light touch grossly.  X-rays of the right wrist including AP,lateral, and scaphoid views are unremarkable. No obvious fracture is seen      Assessment & Plan:   Right wrist pain worrisome for scapholunate ligament tear  MRI arthrogram specifically to rule out scapholunate ligament tear. I've given her a cockup wrist brace to wear for comfort and I will call her with the results of her MRI arthrogram once available at which point we will delineate further treatment.

## 2016-01-23 NOTE — Addendum Note (Signed)
Addended by: Cyd Silence on: 01/23/2016 09:09 AM   Modules accepted: Orders

## 2016-01-30 ENCOUNTER — Other Ambulatory Visit: Payer: Self-pay | Admitting: *Deleted

## 2016-01-30 ENCOUNTER — Other Ambulatory Visit: Payer: Self-pay | Admitting: Sports Medicine

## 2016-01-30 DIAGNOSIS — M25531 Pain in right wrist: Secondary | ICD-10-CM

## 2016-02-07 ENCOUNTER — Ambulatory Visit: Admission: RE | Admit: 2016-02-07 | Payer: BLUE CROSS/BLUE SHIELD | Source: Ambulatory Visit

## 2016-02-07 ENCOUNTER — Ambulatory Visit: Payer: BLUE CROSS/BLUE SHIELD

## 2016-03-26 ENCOUNTER — Ambulatory Visit (INDEPENDENT_AMBULATORY_CARE_PROVIDER_SITE_OTHER): Payer: BLUE CROSS/BLUE SHIELD

## 2016-03-26 ENCOUNTER — Encounter (INDEPENDENT_AMBULATORY_CARE_PROVIDER_SITE_OTHER): Payer: Self-pay

## 2016-03-26 DIAGNOSIS — Z23 Encounter for immunization: Secondary | ICD-10-CM

## 2016-03-26 NOTE — Progress Notes (Signed)
Received flu shot

## 2016-11-18 ENCOUNTER — Other Ambulatory Visit: Payer: Self-pay | Admitting: Obstetrics and Gynecology

## 2016-11-18 MED ORDER — PREDNISONE 10 MG (21) PO TBPK: ORAL_TABLET | ORAL | 0 refills | Status: DC

## 2016-11-18 NOTE — Progress Notes (Signed)
Pt with poison ivy/oak. Rx pred.

## 2017-03-24 ENCOUNTER — Ambulatory Visit (INDEPENDENT_AMBULATORY_CARE_PROVIDER_SITE_OTHER): Payer: BLUE CROSS/BLUE SHIELD | Admitting: Primary Care

## 2017-03-24 ENCOUNTER — Encounter: Payer: Self-pay | Admitting: Primary Care

## 2017-03-24 DIAGNOSIS — F411 Generalized anxiety disorder: Secondary | ICD-10-CM | POA: Diagnosis not present

## 2017-03-24 DIAGNOSIS — T691XXD Chilblains, subsequent encounter: Secondary | ICD-10-CM

## 2017-03-24 MED ORDER — SERTRALINE HCL 50 MG PO TABS: 50.0000 mg | ORAL_TABLET | Freq: Every day | ORAL | 1 refills | Status: DC

## 2017-03-24 MED ORDER — HYDROXYZINE HCL 10 MG PO TABS: 10.0000 mg | ORAL_TABLET | Freq: Two times a day (BID) | ORAL | 0 refills | Status: DC | PRN

## 2017-03-24 NOTE — Assessment & Plan Note (Signed)
Long history of anxiety and depression. Once did well on Zoloft. GAD 7 score of 11 today. Discussed options for treatment, she would like retry Zoloft. Discussed that I will not be prescribing benzos for acute anxiety as she was once taking these daily, not best practice.    Rx for Zoloft 50 mg sent to pharmacy. Patient is to take 1/2 tablet daily for 8 days, then advance to 1 full tablet thereafter. Will provide Rx for hydroxyzine tablets to use PRN for acute anxiety until Zoloft can start taking effect. We discussed possible side effects of headache, GI upset, drowsiness, and SI/HI. If thoughts of SI/HI develop, we discussed to present to the emergency immediately. Patient verbalized understanding.   Follow up in 6 weeks.

## 2017-03-24 NOTE — Progress Notes (Signed)
Subjective:    Patient ID: Mary Mason, female    DOB: 1970/08/24, 46 y.o.   MRN: [id number]  HPI  Mary Mason is a 46 year old female who presents today to establish care and discuss the problems mentioned below. Will obtain old records.  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 Experiences symptoms of erythema to hands, white knuckles, decreased sensation, decrease in ROM. This will typically occur during damp, cool weather above freezing. Flares are infrequent overall. Doesn't take anything for acute flares.  2) Anxiety and Depression: Diagnosed years ago. Previously managed on Zoloft 100 mg for several years, did well. She came off of this years ago and did okay off of the Zoloft, but slowly noticed an increase in anxiety. She was also managed on lorazepam for which she took everyday most of the time. Over the past 2 weeks she's been taking Xanax and clonazepam from friends.   Over the last 3-4 months she's had increased stress with work and her personal life. She's experiencing symptoms of anxiety, worry, difficulty controlling worry, irritability, having difficulty dealing with change. She denies difficulty sleeping. GAD 7 score of 11 and PHQ 9 score of 4 today. She denies SI/HI.   Review of Systems  Constitutional: Positive for fatigue.  Respiratory: Negative for shortness of breath.   Cardiovascular: Negative for chest pain.  Musculoskeletal:       Occasional joint aches of her hands  Psychiatric/Behavioral: Negative for sleep disturbance and suicidal ideas. The patient is nervous/anxious.        Past Medical History:  Diagnosis Date  . Arthritis   . Chilblains   . Depression      Social History   Social History  . Marital status: Single    Spouse name: N/A  . Number of children: N/A  . Years of education: N/A   Occupational History  . Not on file.   Social History  Main Topics  . Smoking status: Never Smoker  . Smokeless tobacco: Never Used  . Alcohol use 0.0 oz/week     Comment: occasional  . Drug use: No  . Sexual activity: Yes    Partners: Male    Birth control/ protection: Pill   Other Topics Concern  . Not on file   Social History Narrative   Divorced   Employed as a Investment banker, corporate   Caffeine- coffee, tea rare, soda    No children    Past Surgical History:  Procedure Laterality Date  . BREAST ENHANCEMENT SURGERY  10-06-2000  . TONSILECTOMY, ADENOIDECTOMY, BILATERAL MYRINGOTOMY AND TUBES      Family History  Problem Relation Age of Onset  . Hearing loss Maternal Grandmother   . Cancer Maternal Grandmother        Liver cancer  . Thyroid nodules Mother   . Cancer Maternal Aunt 10-06-2044       Breast cancer- died at 13    No Known Allergies  Current Outpatient Prescriptions on File Prior to Visit  Medication Sig Dispense Refill  . ASHLYNA 0.15-0.03 &0.01 MG tablet Take 1 tablet by mouth daily.  0  . aspirin 81 MG chewable tablet Chew by mouth.    . Multiple Vitamins-Minerals (MULTIVITAMIN WITH MINERALS) tablet Take 1 tablet by mouth daily.       No current facility-administered medications on file prior to visit.     BP 118/86  Pulse 78   Temp 97.8 F (36.6 C) (Oral)   Ht 5' 5.25" (1.657 m)   Wt 134 lb 6.4 oz (61 kg)   SpO2 99%   BMI 22.19 kg/m    Objective:   Physical Exam  Constitutional: She appears well-nourished.  Neck: Neck supple.  Cardiovascular: Normal rate and regular rhythm.   Pulmonary/Chest: Effort normal and breath sounds normal.  Skin: Skin is warm and dry.  Psychiatric: She has a normal mood and affect.          Assessment & Plan:

## 2017-03-24 NOTE — Assessment & Plan Note (Signed)
Overall manages well without treatment. Flares are infrequent, mostly with cool, damp weather. Continue to monitor.

## 2017-03-24 NOTE — Patient Instructions (Signed)
Start sertraline (Zoloft) 50 mg tablets for anxiety. Start by taking 1/2 tablet daily for 6 days, then increase to 1 full tablet thereafter.  You may take the hydroxyzine up to twice daily as needed for acute anxiety/panic attacks. Caution as this may cause drowsiness.  Please schedule a follow up visit in 6 weeks for re-evaluation.  It was a pleasure to meet you today! Please don't hesitate to call me with any questions. Welcome to [facility] at [facility]!

## 2017-03-31 ENCOUNTER — Telehealth: Payer: Self-pay

## 2017-03-31 NOTE — Telephone Encounter (Signed)
Please notify patient that it takes 4-6 weeks for the medication to reach it's full effect. I recommend she give the medication another 2 weeks. Have her take it at bedtime to reduce the tired feeling. Have her update me in 2 weeks, if no improvement then we can consider switching meds.

## 2017-03-31 NOTE — Telephone Encounter (Signed)
Message left for patient to return my call.  

## 2017-03-31 NOTE — Telephone Encounter (Signed)
Pt left v/m; pt seen 03/24/17 to establish; has f/u appt on 05/12/17; anxiety med is not effective, does not take edge off; the only difference is it makes pt tired and sleepy. Pt request different med to  [place] request cb.

## 2017-04-01 ENCOUNTER — Encounter: Payer: Self-pay | Admitting: Primary Care

## 2017-04-01 NOTE — Telephone Encounter (Signed)
Spoken and notified patient of Kate's comments. Patient verbalized understanding.  She stated that the hydroxyzine is not helping. When she takes the medication, she still have anxiety and now very tired as well. Patient stated is there something else she can take instead of the hydroxyzine which will not make her tired. Please advise.

## 2017-04-01 NOTE — Telephone Encounter (Signed)
Patient returned Chan's call.  Patient can be reached at [phone].

## 2017-04-01 NOTE — Telephone Encounter (Signed)
Pt returned your call. [phone]

## 2017-04-01 NOTE — Telephone Encounter (Signed)
Message left for patient to return my call.  

## 2017-04-02 NOTE — Telephone Encounter (Signed)
Caller Name:trish Griebel Relationship to Patient:self Best Peshtigo:  Reason for call: pt returned your call - please call back

## 2017-04-02 NOTE — Telephone Encounter (Signed)
Has she increased to the full Zoloft tablet yet? If not then have her do so now, if so then how long has she been taking the full tablet?

## 2017-04-03 NOTE — Telephone Encounter (Signed)
Spoken to patient and she stated that she have already increased to a full tablet for about a week now

## 2017-04-03 NOTE — Telephone Encounter (Signed)
Spoken and notified patient of Kate's comments. Patient verbalized understanding. 

## 2017-04-03 NOTE — Telephone Encounter (Signed)
He her increase to 2 tablets daily for the next 1 week then update [name].

## 2017-05-12 ENCOUNTER — Encounter: Payer: Self-pay | Admitting: Primary Care

## 2017-05-12 ENCOUNTER — Ambulatory Visit: Payer: BLUE CROSS/BLUE SHIELD | Admitting: Primary Care

## 2017-05-12 ENCOUNTER — Encounter (INDEPENDENT_AMBULATORY_CARE_PROVIDER_SITE_OTHER): Payer: Self-pay

## 2017-05-12 VITALS — BP 116/76 | HR 65 | Temp 98.2°F | Ht 65.25 in | Wt 132.4 lb

## 2017-05-12 DIAGNOSIS — F411 Generalized anxiety disorder: Secondary | ICD-10-CM

## 2017-05-12 DIAGNOSIS — Z23 Encounter for immunization: Secondary | ICD-10-CM

## 2017-05-12 MED ORDER — SERTRALINE HCL 50 MG PO TABS: 50.0000 mg | ORAL_TABLET | Freq: Every day | ORAL | 2 refills | Status: DC

## 2017-05-12 MED ORDER — BUSPIRONE HCL 7.5 MG PO TABS: 7.5000 mg | ORAL_TABLET | Freq: Two times a day (BID) | ORAL | 1 refills | Status: DC

## 2017-05-12 NOTE — Assessment & Plan Note (Signed)
Does seem improved since addition of Zoloft. Would like to avoid benzos given frequent episodes of acute anxiety during her work week. Will add on buspirone 7.5 mg BID. Continue Zoloft 50 mg. She will update through My Chart in 4 weeks.

## 2017-05-12 NOTE — Patient Instructions (Signed)
Continue sertraline (Zoloft) 50 mg.   Start buspirone (Buspar) 7.5 mg tablets for anxiety. Take 1 tablet by mouth twice daily.  Please update me online in 4 weeks.  It was a pleasure to see you today!

## 2017-05-12 NOTE — Progress Notes (Signed)
Subjective:    Patient ID: Mary Mason, female    DOB: [birth year], 46 y.o.   MRN: [id number]  HPI  Mary Mason is a 46 year old female who presents today for follow up of anxiety. She presented in late September 2018 with complaints of increased stress, anxiety, daily worry, irritability. Her GAD 7 score was 11 and PHQ 9 score was 4. She had previously been treated with Zoloft 100 mg in the past and did well historically. During her last visit we re-initiated her Zoloft at 50 mg, then increased to 100 mg several weeks later as she reported no improvement in symptoms. She was also initiated on hydroxyzine to use PRN for acute anxiety.   Since her last visit she's currently taking Zoloft 50 mg. She's noticed improvement in anxiety and is feeling some better. Positive effects include less anxious, able to better handle stressful situations, feeling less depressed. She continues to experience bouts of anxiety which occur at work or with increased stress. This will occur 3-4 days weekly on average. The hydroxyzine caused her to feel very drowsy so she's not taken this recently. She denies SI/HI, GI upset, nausea, headaches.  She once saw numerous therapists in the past but didn't feel like she got anything out of the appointments.   Review of Systems  Respiratory: Negative for shortness of breath.   Cardiovascular: Negative for chest pain.  Gastrointestinal: Negative for nausea.  Neurological: Negative for headaches.  Psychiatric/Behavioral:       See HPI       Past Medical History:  Diagnosis Date  . Arthritis   . Cellulitis   . Chilblain lupus erythematosus   . Chilblains   . Depression   . GAD (generalized anxiety disorder)      Social History   Socioeconomic History  . Marital status: Single    Spouse name: Not on file  . Number of children: Not on file  . Years of education: Not on file  . Highest education level: Not on file  Social Needs  . Financial resource strain: Not  on file  . Food insecurity - worry: Not on file  . Food insecurity - inability: Not on file  . Transportation needs - medical: Not on file  . Transportation needs - non-medical: Not on file  Occupational History  . Not on file  Tobacco Use  . Smoking status: Never Smoker  . Smokeless tobacco: Never Used  Substance and Sexual Activity  . Alcohol use: Yes    Alcohol/week: 0.0 oz    Comment: occasional  . Drug use: No  . Sexual activity: Yes    Partners: Male    Birth control/protection: Pill  Other Topics Concern  . Not on file  Social History Narrative   Divorced   Employed as a Dance movement psychotherapist   No children.   Enjoys exercising, spending time with friends.    Past Surgical History:  Procedure Laterality Date  . BREAST ENHANCEMENT SURGERY  10/19/00  . TONSILECTOMY, ADENOIDECTOMY, BILATERAL MYRINGOTOMY AND TUBES      Family History  Problem Relation Age of Onset  . Hearing loss Maternal Grandmother   . Cancer Maternal Grandmother        Liver cancer  . Thyroid nodules Mother   . Cancer Maternal Aunt 2044/10/19       Breast cancer- died at 85    No Known Allergies  Current Outpatient Medications on File Prior to Visit  Medication Sig Dispense Refill  .  ASHLYNA 0.15-0.03 &0.01 MG tablet Take 1 tablet by mouth daily.  0  . aspirin 81 MG chewable tablet Chew by mouth.    . Multiple Vitamins-Minerals (MULTIVITAMIN WITH MINERALS) tablet Take 1 tablet by mouth daily.      . sertraline (ZOLOFT) 50 MG tablet Take 1 tablet (50 mg total) by mouth daily. 30 tablet 1  . hydrOXYzine (ATARAX/VISTARIL) 10 MG tablet Take 1 tablet (10 mg total) by mouth 2 (two) times daily as needed for anxiety. (Patient not taking: Reported on 05/12/2017) 30 tablet 0   No current facility-administered medications on file prior to visit.     BP 116/76   Pulse 65   Temp 98.2 F (36.8 C) (Oral)   Ht 5' 5.25" (1.657 m)   Wt 132 lb 6.4 oz (60.1 kg)   SpO2 98%   BMI 21.86 kg/m    Objective:    Physical Exam  Constitutional: She appears well-nourished.  Neck: Neck supple.  Cardiovascular: Normal rate and regular rhythm.  Pulmonary/Chest: Effort normal and breath sounds normal.  Skin: Skin is warm and dry.  Psychiatric: She has a normal mood and affect.          Assessment & Plan:

## 2017-05-12 NOTE — Addendum Note (Signed)
Addended by: Jacqualin Combes on: 05/12/2017 04:42 PM   Modules accepted: Orders

## 2017-05-18 ENCOUNTER — Encounter: Payer: Self-pay | Admitting: Primary Care

## 2017-05-19 NOTE — Telephone Encounter (Signed)
Will you call over or delegate someone to call over to [facility]? I requested labs which I never received. I received their last office visit note but no labs. Thanks.

## 2017-05-20 NOTE — Telephone Encounter (Signed)
Did you see my message?

## 2017-05-28 NOTE — Telephone Encounter (Signed)
Message left and waiting for someone to call back from [facility]

## 2017-06-12 NOTE — Telephone Encounter (Signed)
Send a fax again requesting lab results.

## 2017-06-12 NOTE — Telephone Encounter (Signed)
Message was left again on 06/06/2017.

## 2017-06-16 NOTE — Telephone Encounter (Signed)
I did received faxed by from [facility] and they do not have any lab results for this patient.

## 2017-06-18 ENCOUNTER — Encounter: Payer: Self-pay | Admitting: Primary Care

## 2017-07-13 ENCOUNTER — Other Ambulatory Visit: Payer: Self-pay | Admitting: Primary Care

## 2017-07-13 DIAGNOSIS — F411 Generalized anxiety disorder: Secondary | ICD-10-CM

## 2017-07-14 ENCOUNTER — Encounter: Payer: Self-pay | Admitting: Primary Care

## 2017-07-14 NOTE — Telephone Encounter (Signed)
Electronic refill request Last refill and office visit 05/12/17 #60/1

## 2017-07-17 NOTE — Telephone Encounter (Signed)
Awaiting response from my chart question since several days ago.

## 2017-07-28 NOTE — Telephone Encounter (Signed)
Received notification from My Chart that patient has not read her message regarding Buspar. Will you please ask if she's still taking? Is it helping?

## 2017-07-28 NOTE — Telephone Encounter (Signed)
Message left for patient to return my call.  

## 2017-07-30 NOTE — Telephone Encounter (Signed)
Message left for patient to return my call.  

## 2017-08-04 NOTE — Telephone Encounter (Signed)
Finally got a hold of patient. She stated that she needs a refill and yes, it is helping.

## 2017-08-04 NOTE — Telephone Encounter (Signed)
Noted, refill sent to pharmacy. 

## 2017-12-15 ENCOUNTER — Other Ambulatory Visit: Payer: Self-pay | Admitting: Obstetrics and Gynecology

## 2017-12-15 MED ORDER — AZITHROMYCIN 250 MG PO TABS: ORAL_TABLET | ORAL | 0 refills | Status: DC

## 2017-12-15 NOTE — Progress Notes (Signed)
Sinusitis sx for 3 wks, sx not improving. Face/teeth pressure.

## 2018-02-14 ENCOUNTER — Other Ambulatory Visit: Payer: Self-pay | Admitting: Primary Care

## 2018-02-14 DIAGNOSIS — F411 Generalized anxiety disorder: Secondary | ICD-10-CM

## 2018-02-16 NOTE — Telephone Encounter (Signed)
Patient will need a follow up visit in November for further refills. Refill for 90 day supply sent to pharmacy. Please schedule.

## 2018-02-16 NOTE — Telephone Encounter (Signed)
Last prescribed on 05/12/2017 Last office visit on 05/12/2017

## 2018-05-19 ENCOUNTER — Ambulatory Visit: Payer: BLUE CROSS/BLUE SHIELD | Admitting: Primary Care

## 2018-05-19 ENCOUNTER — Encounter: Payer: Self-pay | Admitting: Primary Care

## 2018-05-19 VITALS — BP 118/72 | HR 67 | Temp 98.2°F | Ht 65.25 in | Wt 137.2 lb

## 2018-05-19 DIAGNOSIS — T691XXD Chilblains, subsequent encounter: Secondary | ICD-10-CM

## 2018-05-19 DIAGNOSIS — Z0000 Encounter for general adult medical examination without abnormal findings: Secondary | ICD-10-CM | POA: Diagnosis not present

## 2018-05-19 DIAGNOSIS — Z23 Encounter for immunization: Secondary | ICD-10-CM | POA: Diagnosis not present

## 2018-05-19 DIAGNOSIS — R5383 Other fatigue: Secondary | ICD-10-CM

## 2018-05-19 DIAGNOSIS — F411 Generalized anxiety disorder: Secondary | ICD-10-CM | POA: Diagnosis not present

## 2018-05-19 LAB — LIPID PANEL
CHOLESTEROL: 147 mg/dL (ref 0–200)
HDL: 68.9 mg/dL (ref >39.00)
LDL Cholesterol: 58 mg/dL (ref 0–99)
NONHDL: 78.01
Total CHOL/HDL Ratio: 2
Triglycerides: 102 mg/dL (ref 0.0–149.0)
VLDL: 20.4 mg/dL (ref 0.0–40.0)

## 2018-05-19 LAB — COMPREHENSIVE METABOLIC PANEL
ALBUMIN: 4.3 g/dL (ref 3.5–5.2)
ALK PHOS: 31 U/L — AB (ref 39–117)
ALT: 21 U/L (ref 0–35)
AST: 25 U/L (ref 0–37)
BILIRUBIN TOTAL: 0.5 mg/dL (ref 0.2–1.2)
BUN: 20 mg/dL (ref 6–23)
CO2: 27 mEq/L (ref 19–32)
Calcium: 9.2 mg/dL (ref 8.4–10.5)
Chloride: 103 mEq/L (ref 96–112)
Creatinine, Ser: 0.94 mg/dL (ref 0.40–1.20)
GFR: 67.75 mL/min (ref >60.00)
GLUCOSE: 106 mg/dL — AB (ref 70–99)
Potassium: 4.6 mEq/L (ref 3.5–5.1)
SODIUM: 136 meq/L (ref 135–145)
TOTAL PROTEIN: 7 g/dL (ref 6.0–8.3)

## 2018-05-19 LAB — CBC
HEMATOCRIT: 42.1 % (ref 36.0–46.0)
HEMOGLOBIN: 14.4 g/dL (ref 12.0–15.0)
MCHC: 34.2 g/dL (ref 30.0–36.0)
MCV: 94.9 fl (ref 78.0–100.0)
PLATELETS: 278 10*3/uL (ref 150.0–400.0)
RBC: 4.44 Mil/uL (ref 3.87–5.11)
RDW: 11.9 % (ref 11.5–15.5)
WBC: 6 10*3/uL (ref 4.0–10.5)

## 2018-05-19 LAB — TSH: TSH: 2.17 u[IU]/mL (ref 0.35–4.50)

## 2018-05-19 LAB — VITAMIN B12: Vitamin B-12: 550 pg/mL (ref 211–911)

## 2018-05-19 LAB — VITAMIN D 25 HYDROXY (VIT D DEFICIENCY, FRACTURES): VITD: 51.62 ng/mL (ref 30.00–100.00)

## 2018-05-19 MED ORDER — SERTRALINE HCL 50 MG PO TABS: 50.0000 mg | ORAL_TABLET | Freq: Every day | ORAL | 3 refills | Status: DC

## 2018-05-19 NOTE — Patient Instructions (Signed)
Stop by the lab prior to leaving today. I will notify you of your results once received.   Continue exercising. You should be getting 150 minutes of moderate intensity exercise weekly.  Continue to eat a healthy diet. Ensure you are consuming 64 ounces of water daily.  You were provided with a tetanus and influenza vaccination today. The tetanus will cover you for 10 years.   We will see you in one year for your annual exam or sooner if needed.  It was a pleasure to see you today!   Preventive Care 40-64 Years, Female Preventive care refers to lifestyle choices and visits with your health care provider that can promote health and wellness. What does preventive care include?  A yearly physical exam. This is also called an annual well check.  Dental exams once or twice a year.  Routine eye exams. Ask your health care provider how often you should have your eyes checked.  Personal lifestyle choices, including: ? Daily care of your teeth and gums. ? Regular physical activity. ? Eating a healthy diet. ? Avoiding tobacco and drug use. ? Limiting alcohol use. ? Practicing safe sex. ? Taking low-dose aspirin daily starting at age 34. ? Taking vitamin and mineral supplements as recommended by your health care provider. What happens during an annual well check? The services and screenings done by your health care provider during your annual well check will depend on your age, overall health, lifestyle risk factors, and family history of disease. Counseling Your health care provider may ask you questions about your:  Alcohol use.  Tobacco use.  Drug use.  Emotional well-being.  Home and relationship well-being.  Sexual activity.  Eating habits.  Work and work Statistician.  Method of birth control.  Menstrual cycle.  Pregnancy history.  Screening You may have the following tests or measurements:  Height, weight, and BMI.  Blood pressure.  Lipid and cholesterol  levels. These may be checked every 5 years, or more frequently if you are over 17 years old.  Skin check.  Lung cancer screening. You may have this screening every year starting at age 24 if you have a 30-pack-year history of smoking and currently smoke or have quit within the past 15 years.  Fecal occult blood test (FOBT) of the stool. You may have this test every year starting at age 35.  Flexible sigmoidoscopy or colonoscopy. You may have a sigmoidoscopy every 5 years or a colonoscopy every 10 years starting at age 37.  Hepatitis C blood test.  Hepatitis B blood test.  Sexually transmitted disease (STD) testing.  Diabetes screening. This is done by checking your blood sugar (glucose) after you have not eaten for a while (fasting). You may have this done every 1-3 years.  Mammogram. This may be done every 1-2 years. Talk to your health care provider about when you should start having regular mammograms. This may depend on whether you have a family history of breast cancer.  BRCA-related cancer screening. This may be done if you have a family history of breast, ovarian, tubal, or peritoneal cancers.  Pelvic exam and Pap test. This may be done every 3 years starting at age 62. Starting at age 26, this may be done every 5 years if you have a Pap test in combination with an HPV test.  Bone density scan. This is done to screen for osteoporosis. You may have this scan if you are at high risk for osteoporosis.  Discuss your test results, treatment  options, and if necessary, the need for more tests with your health care provider. Vaccines Your health care provider may recommend certain vaccines, such as:  Influenza vaccine. This is recommended every year.  Tetanus, diphtheria, and acellular pertussis (Tdap, Td) vaccine. You may need a Td booster every 10 years.  Varicella vaccine. You may need this if you have not been vaccinated.  Zoster vaccine. You may need this after age  35.  Measles, mumps, and rubella (MMR) vaccine. You may need at least one dose of MMR if you were born in 1957 or later. You may also need a second dose.  Pneumococcal 13-valent conjugate (PCV13) vaccine. You may need this if you have certain conditions and were not previously vaccinated.  Pneumococcal polysaccharide (PPSV23) vaccine. You may need one or two doses if you smoke cigarettes or if you have certain conditions.  Meningococcal vaccine. You may need this if you have certain conditions.  Hepatitis A vaccine. You may need this if you have certain conditions or if you travel or work in places where you may be exposed to hepatitis A.  Hepatitis B vaccine. You may need this if you have certain conditions or if you travel or work in places where you may be exposed to hepatitis B.  Haemophilus influenzae type b (Hib) vaccine. You may need this if you have certain conditions.  Talk to your health care provider about which screenings and vaccines you need and how often you need them. This information is not intended to replace advice given to you by your health care provider. Make sure you discuss any questions you have with your health care provider. Document Released: 07/14/2015 Document Revised: 03/06/2016 Document Reviewed: 04/18/2015 Elsevier Interactive Patient Education  [employer].

## 2018-05-19 NOTE — Progress Notes (Signed)
Subjective:    Patient ID: Mary Mason, female    DOB: 1970-10-03, 47 y.o.   MRN: [id number]  HPI  Mary Mason is a 47 year old female who presents today for complete physical.  She's also experiencing fatigue. She feels that she can fall asleep anytime when resting. She feels fine when she's up and active. She sleeps well during the night, denies snoring. Her anxiety feels well managed. She has a family history of thyroid nodules, mother had to have thyroid gland removed. She does get up very early for work and does exercising training throughout the day. She takes OCP's continuously to avoid a menstrual cycle, follows with GYN.    Immunizations: -Tetanus: Unsure, believes it's been over 10 years.  -Influenza: Due today   Diet: She endorses a healthy diet Breakfast: Protein shake, protein muffins Lunch: Protein, vegetables Dinner: Protein, vegetables Snacks: Crackers, cheese, nuts, avocado  Desserts: Chocolate. Daily.  Beverages: Coffee, water, protein shake, wine  Exercise: She is exercising 6 days weekly Eye exam: Completed in 2019 Dental exam: Completes semi-annually  Pap Smear: Completed in 2018 Mammogram: Completed in 2019    Review of Systems  Constitutional: Positive for fatigue. Negative for unexpected weight change.  HENT: Negative for rhinorrhea.   Respiratory: Negative for cough and shortness of breath.   Cardiovascular: Negative for chest pain.  Gastrointestinal: Negative for constipation and diarrhea.  Genitourinary: Negative for difficulty urinating and menstrual problem.  Musculoskeletal: Negative for arthralgias and myalgias.  Skin: Negative for rash.  Allergic/Immunologic: Negative for environmental allergies.  Neurological: Negative for dizziness, numbness and headaches.  Psychiatric/Behavioral:       Anxiety improved on Zoloft        Past Medical History:  Diagnosis Date  . Arthritis   . Cellulitis   . Chilblain lupus erythematosus   .  Chilblains   . Depression   . GAD (generalized anxiety disorder)      Social History   Socioeconomic History  . Marital status: Single    Spouse name: Not on file  . Number of children: Not on file  . Years of education: Not on file  . Highest education level: Not on file  Occupational History  . Not on file  Social Needs  . Financial resource strain: Not on file  . Food insecurity:    Worry: Not on file    Inability: Not on file  . Transportation needs:    Medical: Not on file    Non-medical: Not on file  Tobacco Use  . Smoking status: Never Smoker  . Smokeless tobacco: Never Used  Substance and Sexual Activity  . Alcohol use: Yes    Alcohol/week: 0.0 standard drinks    Comment: occasional  . Drug use: No  . Sexual activity: Yes    Partners: Male    Birth control/protection: Pill  Lifestyle  . Physical activity:    Days per week: Not on file    Minutes per session: Not on file  . Stress: Not on file  Relationships  . Social connections:    Talks on phone: Not on file    Gets together: Not on file    Attends religious service: Not on file    Active member of club or organization: Not on file    Attends meetings of clubs or organizations: Not on file    Relationship status: Not on file  . Intimate partner violence:    Fear of current or ex partner: Not on  file    Emotionally abused: Not on file    Physically abused: Not on file    Forced sexual activity: Not on file  Other Topics Concern  . Not on file  Social History Narrative   Divorced   Employed as a Dance movement psychotherapist   No children.   Enjoys exercising, spending time with friends.    Past Surgical History:  Procedure Laterality Date  . BREAST ENHANCEMENT SURGERY  2000-09-26  . TONSILECTOMY, ADENOIDECTOMY, BILATERAL MYRINGOTOMY AND TUBES      Family History  Problem Relation Age of Onset  . Hearing loss Maternal Grandmother   . Cancer Maternal Grandmother        Liver cancer  . Thyroid  nodules Mother   . Cancer Maternal Aunt 09-26-44       Breast cancer- died at 52    No Known Allergies  Current Outpatient Medications on File Prior to Visit  Medication Sig Dispense Refill  . ASHLYNA 0.15-0.03 &0.01 MG tablet Take 1 tablet by mouth daily.  0  . Cholecalciferol (D3 ADULT PO) Take 5,000 mg by mouth daily.    . Multiple Vitamins-Minerals (MULTIVITAMIN WITH MINERALS) tablet Take 1 tablet by mouth daily.       No current facility-administered medications on file prior to visit.     BP 118/72   Pulse 67   Temp 98.2 F (36.8 C) (Oral)   Ht 5' 5.25" (1.657 m)   Wt 137 lb 4 oz (62.3 kg)   SpO2 94%   BMI 22.66 kg/m    Objective:   Physical Exam  Constitutional: She is oriented to person, place, and time. She appears well-nourished.  HENT:  Mouth/Throat: No oropharyngeal exudate.  Eyes: Pupils are equal, round, and reactive to light. EOM are normal.  Neck: Neck supple. No thyromegaly present.  Cardiovascular: Normal rate and regular rhythm.  Respiratory: Effort normal and breath sounds normal.  GI: Soft. Bowel sounds are normal. There is no tenderness.  Musculoskeletal: Normal range of motion.  Neurological: She is alert and oriented to person, place, and time.  Skin: Skin is warm and dry.  Psychiatric: She has a normal mood and affect.           Assessment & Plan:

## 2018-05-19 NOTE — Addendum Note (Signed)
Addended by: Jacqualin Combes on: 05/19/2018 04:30 PM   Modules accepted: Orders

## 2018-05-19 NOTE — Assessment & Plan Note (Signed)
Tetanus due, provided today. Influenza vaccination also due, provided today. Pap smear UTD. Mammogram UTD.  Commended her on a healthy lifestyle, encouraged to continue. Exam unremarkable. Labs pending. Follow up in 1 year for CPE.

## 2018-05-19 NOTE — Assessment & Plan Note (Signed)
Intermittent, recent episode over the last 3-4 months.  Exam today unremarkable.  Check labs today including TSH, CBC, B12, Vitamin D.  Could be secondary to early work schedule, also with consistent exercise throughout the day. Could be early menopause but hard to tell given recurrent use of OCP's. Doubt sleep apnea given lack of cardinal signs/symptoms.  Await labs.

## 2018-05-19 NOTE — Assessment & Plan Note (Signed)
Overall doing well. Continue to monitor.

## 2018-05-19 NOTE — Assessment & Plan Note (Addendum)
Doing well on Zoloft 50 mg, continue same. Denies SI/HI. Not taking Buspar as it's no longer needed, removed from medication list.

## 2018-08-03 ENCOUNTER — Encounter: Payer: Self-pay | Admitting: Sports Medicine

## 2018-08-03 ENCOUNTER — Ambulatory Visit (INDEPENDENT_AMBULATORY_CARE_PROVIDER_SITE_OTHER): Payer: PRIVATE HEALTH INSURANCE | Admitting: Sports Medicine

## 2018-08-03 ENCOUNTER — Ambulatory Visit
Admission: RE | Admit: 2018-08-03 | Discharge: 2018-08-03 | Disposition: A | Discharge status: Home / Self Care | Payer: PRIVATE HEALTH INSURANCE | Source: Ambulatory Visit | Attending: Sports Medicine | Admitting: Sports Medicine

## 2018-08-03 VITALS — BP 110/74 | Ht 65.5 in | Wt 135.0 lb

## 2018-08-03 DIAGNOSIS — M25551 Pain in right hip: Secondary | ICD-10-CM

## 2018-08-03 MED ORDER — DICLOFENAC SODIUM 75 MG PO TBEC: DELAYED_RELEASE_TABLET | ORAL | 0 refills | Status: DC

## 2018-08-03 NOTE — Progress Notes (Signed)
Subjective:    Patient ID: Mary Mason is a 48 y.o.   Chief Complaint: R hip pain HPI  Mary Mason is a 48yo coming in for progressive R hip pain for the past 5 months. She reports that the pain occasionally feels like stabbing in her R groin, other times feels like stabbing in the lateral aspect of her R hip, and at other times an aching in her R lower back and back fatigue. The pain occurs more with hip flexion, and is more common when she has been standing for a long time. It occurs at all times of day but possibly moreso in the mornings and at night, or at the beginning or end of a workout. Last week she reports she could not take off her shoes because flexing her hip hurt too much. She has seen other providers for this and at first thought it was an iliopsoas issue. She has gotten chiropractic adjustments, taken turmeric, advil (4 pills 2x/day but somewhat inconsistently), and used heat and ice with no improvement over the past three months. She denies any knee pain. She has not had any urinary symptoms including incontinence, frequency, or hematuria.    Social History   Occupational History  . Not on file  Tobacco Use  . Smoking status: Never Smoker  . Smokeless tobacco: Never Used  Substance and Sexual Activity  . Alcohol use: Yes    Alcohol/week: 0.0 standard drinks    Comment: occasional  . Drug use: No  . Sexual activity: Yes    Partners: Male    Birth control/protection: Pill    ROS  As above.     Objective:   Ortho Exam    R hip: inspection unremarkable; no tenderness with palpation; negative C sign; internal rotation limited by pain in comparison with L hip; positive FADIR; negative trandelenberg; 5/5 strength in flexion, extension, internal and external rotation, adduction, and abduction L hip: inspection unremarkable; complete ROM without pain  Assessment:     1. 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 Will look for OA with XR and FAI with MRI if XR is negative.     Plan:     - Diclofenac BID - XR R hip AP and lateral - If hip XR insignificant, will do MRI  Addendum: X-rays do not show significant amount of arthritis in the right hip.  She has not made any improvement with conservative treatment to date including physical therapy, chiropractic treatment, and activity modification.  Oral anti-inflammatories have also been ineffective.  I am concerned that the patient may have femoral acetabular impingement.  I will order an MRI arthrogram to evaluate further.  Phone follow-up with those findings when available and we will delineate further treatment based on those results.

## 2018-08-04 ENCOUNTER — Encounter: Payer: Self-pay | Admitting: Sports Medicine

## 2018-08-07 ENCOUNTER — Encounter: Payer: Self-pay | Admitting: Primary Care

## 2018-08-07 ENCOUNTER — Ambulatory Visit (INDEPENDENT_AMBULATORY_CARE_PROVIDER_SITE_OTHER): Payer: PRIVATE HEALTH INSURANCE | Admitting: Primary Care

## 2018-08-07 DIAGNOSIS — M2550 Pain in unspecified joint: Secondary | ICD-10-CM

## 2018-08-07 NOTE — Progress Notes (Signed)
Subjective:    Patient ID: Mary Mason, female    DOB: 1970-08-04, 48 y.o.   MRN: [id number]  HPI  Mary Mason is a 48 year old female with a history of abnormal pap smear, fatigue, arthralgia who presents today with a chief complaint of chronic right pelvic, hip, lower back pain.  She was last evaluated by Sports Medicine on 08/03/18 for progressive right hip pain x 5 months. She endorsed stabbing to the right groin, stabbing to the lateral right hip, lower back pain and fatigue. Worse with flexion, standing, and after workouts. She underwent xray of the hip which was negative for significant arthritic involvement. She was treated with diclofenac BID and scheduled for MRI. She was told that she likely has a labral tear to the right hip.   She is scheduled for a MRI for Wednesday next week. She is convinced that her symptoms are secondary to her psoas muscle, rather than a labral tear.  Her symptoms have not been persistent for 6 months and are predominantly located to the right groin, right hip, right mid to lower back with back fatigue.  She describes her symptoms as sharp, stabbing and sore. Her pain will hit her through the right lower back through her pelvis and groin. She will find herself leaning to the left side to pick up objects due to pain on the right. She will sometimes have to manually lift her leg when sitting and standing to put on her shoes, shave her legs, wash her foot. She feels her pain to the right groin with those activities. She will feel pressure to the right lower back through her mid thigh when laying onto her right and left side. She does have tenderness to her right lower back with palpation.   Symptoms are worse with standing and sitting. She was sitting on her couch last night with her feet propped up and noticed pain from her right lower back to right upper leg. She is a fitness trainer and exercises regularly. Pain is mostly with doing activities of daily living. She  can do her workouts overall without difficulty, some occasional symptoms with deep lunges or certain moves. She has no pain when laying on supine during sleep.   She has been seeing her chiropractor who has done manipulations with temporary improvement but without resolve. She's tried altering her workouts since the holidays but hasn't found improvement.  She is taking diclofenac BID and has noticed some improvement recently. She has not undergone physical therapy. She has not undergone a pelvic ultrasound. She denies vaginal itching, discharge, urinary symptoms.   Review of Systems  Constitutional: Negative for fever.  Genitourinary: Positive for pelvic pain. Negative for dysuria, frequency and vaginal discharge.  Musculoskeletal: Positive for arthralgias and back pain.  Neurological: Positive for weakness. Negative for numbness.       Past Medical History:  Diagnosis Date  . Arthritis   . Cellulitis   . Chilblain lupus erythematosus   . Chilblains   . Depression   . GAD (generalized anxiety disorder)      Social History   Socioeconomic History  . Marital status: Single    Spouse name: Not on file  . Number of children: Not on file  . Years of education: Not on file  . Highest education level: Not on file  Occupational History  . Not on file  Social Needs  . Financial resource strain: Not on file  . Food insecurity:    Worry:  Not on file    Inability: Not on file  . Transportation needs:    Medical: Not on file    Non-medical: Not on file  Tobacco Use  . Smoking status: Never Smoker  . Smokeless tobacco: Never Used  Substance and Sexual Activity  . Alcohol use: Yes    Alcohol/week: 0.0 standard drinks    Comment: occasional  . Drug use: No  . Sexual activity: Yes    Partners: Male    Birth control/protection: Pill  Lifestyle  . Physical activity:    Days per week: Not on file    Minutes per session: Not on file  . Stress: Not on file  Relationships  . Social  connections:    Talks on phone: Not on file    Gets together: Not on file    Attends religious service: Not on file    Active member of club or organization: Not on file    Attends meetings of clubs or organizations: Not on file    Relationship status: Not on file  . Intimate partner violence:    Fear of current or ex partner: Not on file    Emotionally abused: Not on file    Physically abused: Not on file    Forced sexual activity: Not on file  Other Topics Concern  . Not on file  Social History Narrative   Divorced   Employed as a Dance movement psychotherapist   No children.   Enjoys exercising, spending time with friends.    Past Surgical History:  Procedure Laterality Date  . BREAST ENHANCEMENT SURGERY  2000/10/08  . TONSILECTOMY, ADENOIDECTOMY, BILATERAL MYRINGOTOMY AND TUBES      Family History  Problem Relation Age of Onset  . Hearing loss Maternal Grandmother   . Cancer Maternal Grandmother        Liver cancer  . Thyroid nodules Mother   . Cancer Maternal Aunt October 08, 2044       Breast cancer- died at 28    No Known Allergies  Current Outpatient Medications on File Prior to Visit  Medication Sig Dispense Refill  . Cholecalciferol (D3 ADULT PO) Take 5,000 mg by mouth daily.    . diclofenac (VOLTAREN) 75 MG EC tablet Take one tab twice daily with food 30 tablet 0  . Levonorgestrel-Ethinyl Estradiol (ASHLYNA) 0.15-0.03 &0.01 MG tablet Take 1 tablet by mouth daily.    . Multiple Vitamins-Minerals (MULTIVITAMIN WITH MINERALS) tablet Take 1 tablet by mouth daily.      . sertraline (ZOLOFT) 50 MG tablet Take 1 tablet (50 mg total) by mouth daily. For anxiety. 90 tablet 3   No current facility-administered medications on file prior to visit.     BP 120/82   Pulse 68   Temp 98.2 F (36.8 C) (Oral)   Ht 5' 5.5" (1.664 m)   Wt 136 lb 8 oz (61.9 kg)   SpO2 95%   BMI 22.37 kg/m    Objective:   Physical Exam  Constitutional: She appears well-nourished.  Cardiovascular: Normal  rate.  Respiratory: Effort normal.  Musculoskeletal:     Right hip: She exhibits normal range of motion, normal strength, no tenderness, no bony tenderness and no deformity.       Legs:     Comments: Normal range of motion in sitting and supine positions.  Negative straight leg raise bilaterally.  Strength 5 out of 5 to bilateral lower extremities.  Skin: Skin is warm and dry.  Assessment & Plan:

## 2018-08-07 NOTE — Patient Instructions (Signed)
Please update me once you get the MRI results.  Continue diclofenac 75 mg twice daily as needed.  It was a pleasure to see you today!

## 2018-08-07 NOTE — Assessment & Plan Note (Signed)
Located to right groin, right hip, right mid to lower back. No improvement over the last 6 months with conservative measures taken by patient. Currently following with sports medicine, will undergo MRI of the hip next week. If MRI without obvious cause, consider physical therapy versus pelvic ultrasound.  Exam overall today unremarkable.

## 2018-08-12 ENCOUNTER — Encounter: Payer: Self-pay | Admitting: *Deleted

## 2018-08-12 ENCOUNTER — Ambulatory Visit
Admission: RE | Admit: 2018-08-12 | Discharge: 2018-08-12 | Disposition: A | Discharge status: Home / Self Care | Payer: PRIVATE HEALTH INSURANCE | Source: Ambulatory Visit | Attending: Sports Medicine | Admitting: Sports Medicine

## 2018-08-12 ENCOUNTER — Ambulatory Visit
Admission: RE | Admit: 2018-08-12 | Discharge: 2018-08-12 | Disposition: A | Discharge status: Home / Self Care | Payer: Self-pay | Source: Ambulatory Visit | Attending: Sports Medicine | Admitting: Sports Medicine

## 2018-08-12 DIAGNOSIS — M25551 Pain in right hip: Secondary | ICD-10-CM

## 2018-08-12 MED ORDER — IOPAMIDOL (ISOVUE-M 200) INJECTION 41%
20.0000 mL | Freq: Once | INTRAMUSCULAR | Status: AC
  Administered 2018-08-12: 20 mL via INTRA_ARTICULAR

## 2018-08-12 NOTE — Patient Instructions (Signed)
Insurance company approved MRI at [facility] for the reason of "continuity of care" and the authorization number is [id number] with dates ranging from 08/10/18 to 09/09/18.  [facility] has been informed by me

## 2018-08-18 ENCOUNTER — Telehealth: Payer: Self-pay | Admitting: Sports Medicine

## 2018-08-18 ENCOUNTER — Other Ambulatory Visit: Payer: Self-pay | Admitting: *Deleted

## 2018-08-18 MED ORDER — DICLOFENAC SODIUM 75 MG PO TBEC: DELAYED_RELEASE_TABLET | ORAL | 0 refills | Status: DC

## 2018-08-18 NOTE — Telephone Encounter (Signed)
Opened by accident

## 2018-08-18 NOTE — Telephone Encounter (Signed)
  I spoke with Mary Mason on the phone yesterday after reviewing MRI arthrogram findings of her right hip.  Dominant finding is some high-grade partial-thickness cartilage loss of the anterior superior right acetabulum with some subchondral cystic changes.  She also has a right superior labral tear which appears to be degenerative in nature.  Based on her level of physical activity, I recommended a referral to Dr. Tawni Levy at [facility] to see if she is a candidate for hip arthroscopy.  She would like to check with her insurance to make sure that Dr. Aretha Parrot is within her network.  She will follow-up with [name] afterwards.

## 2018-08-21 ENCOUNTER — Encounter: Payer: Self-pay | Admitting: *Deleted

## 2018-08-21 NOTE — Patient Instructions (Signed)
[place]. Timmothy Sours, MD Tuesday, April 28th at [facility] [address], [place], [place] [address] Phone: [phone] Fax: [phone]

## 2019-01-08 ENCOUNTER — Other Ambulatory Visit (HOSPITAL_COMMUNITY)
Admission: RE | Admit: 2019-01-08 | Discharge: 2019-01-08 | Disposition: A | Discharge status: Home / Self Care | Payer: PRIVATE HEALTH INSURANCE | Source: Ambulatory Visit | Attending: Primary Care | Admitting: Primary Care

## 2019-01-08 ENCOUNTER — Other Ambulatory Visit: Payer: Self-pay

## 2019-01-08 ENCOUNTER — Encounter: Payer: Self-pay | Admitting: Primary Care

## 2019-01-08 ENCOUNTER — Ambulatory Visit (INDEPENDENT_AMBULATORY_CARE_PROVIDER_SITE_OTHER): Payer: PRIVATE HEALTH INSURANCE | Admitting: Primary Care

## 2019-01-08 VITALS — BP 116/76 | HR 59 | Temp 98.7°F | Ht 65.5 in | Wt 135.8 lb

## 2019-01-08 DIAGNOSIS — Z124 Encounter for screening for malignant neoplasm of cervix: Secondary | ICD-10-CM | POA: Insufficient documentation

## 2019-01-08 MED ORDER — LEVONORGEST-ETH ESTRAD 91-DAY 0.15-0.03 &0.01 MG PO TABS: 1.0000 | ORAL_TABLET | Freq: Every day | ORAL | 3 refills | Status: DC

## 2019-01-08 NOTE — Patient Instructions (Signed)
We will be in touch once we receive your pap smear results.   It was a pleasure to see you today!   Pap Test Why am I having this test? A Pap test, also called a Pap smear, is a screening test to check for signs of:  Cancer of the vagina, cervix, and uterus. The cervix is the lower part of the uterus that opens into the vagina.  Infection.  Changes that may be a sign that cancer is developing (precancerous changes). Women need this test on a regular basis. In general, you should have a Pap test every 3 years until you reach menopause or age 85. Women aged 30-60 may choose to have their Pap test done at the same time as an HPV (human papillomavirus) test every 5 years (instead of every 3 years). Your health care provider may recommend having Pap tests more or less often depending on your medical conditions and past Pap test results. What kind of sample is taken?  Your health care provider will collect a sample of cells from the surface of your cervix. This will be done using a small cotton swab, plastic spatula, or brush. This sample is often collected during a pelvic exam, when you are lying on your back on an exam table with feet in footrests (stirrups). In some cases, fluids (secretions) from the cervix or vagina may also be collected. How do I prepare for this test?  Be aware of where you are in your menstrual cycle. If you are menstruating on the day of the test, you may be asked to reschedule.  You may need to reschedule if you have a known vaginal infection on the day of the test.  Follow instructions from your health care provider about: ? Changing or stopping your regular medicines. Some medicines can cause abnormal test results, such as digitalis and tetracycline. ? Avoiding douching or taking a bath the day before or the day of the test. Tell a health care provider about:  Any allergies you have.  All medicines you are taking, including vitamins, herbs, eye drops, creams,  and over-the-counter medicines.  Any blood disorders you have.  Any surgeries you have had.  Any medical conditions you have.  Whether you are pregnant or may be pregnant. How are the results reported? Your test results will be reported as either abnormal or normal. A false-positive result can occur. A false positive is incorrect because it means that a condition is present when it is not. A false-negative result can occur. A false negative is incorrect because it means that a condition is not present when it is. What do the results mean? A normal test result means that you do not have signs of cancer of the vagina, cervix, or uterus. An abnormal result may mean that you have:  Cancer. A Pap test by itself is not enough to diagnose cancer. You will have more tests done in this case.  Precancerous changes in your vagina, cervix, or uterus.  Inflammation of the cervix.  An STD (sexually transmitted disease).  A fungal infection.  A parasite infection. Talk with your health care provider about what your results mean. Questions to ask your health care provider Ask your health care provider, or the department that is doing the test:  When will my results be ready?  How will I get my results?  What are my treatment options?  What other tests do I need?  What are my next steps? Summary  In general,  women should have a Pap test every 3 years until they reach menopause or age 66.  Your health care provider will collect a sample of cells from the surface of your cervix. This will be done using a small cotton swab, plastic spatula, or brush.  In some cases, fluids (secretions) from the cervix or vagina may also be collected. This information is not intended to replace advice given to you by your health care provider. Make sure you discuss any questions you have with your health care provider. Document Released: 09/07/2002 Document Revised: 02/24/2017 Document Reviewed:  02/24/2017 Elsevier Patient Education  2020 [employer].

## 2019-01-08 NOTE — Progress Notes (Signed)
Subjective:    Patient ID: Mary Mason, female    DOB: August 14, 1970, 48 y.o.   MRN: [id number]  HPI  Mary Mason is a 48 year old female with a history of abnormal pap smear who presents today for pap smear and refill of her birth control.   She takes her birth control continuously, does not have a menstrual cycle due to personal preference. She previously had irregular menstrual cycles which is the main reason for her continuous use. She denies history of menorrhagia and dysmenorrhea.    She will be setting up a mammogram soon through [facility].    Review of Systems  Gastrointestinal: Negative for abdominal pain.  Genitourinary: Negative for dysuria, frequency, menstrual problem and vaginal discharge.       Past Medical History:  Diagnosis Date  . Arthritis   . Cellulitis   . Chilblain lupus erythematosus   . Chilblains   . Depression   . GAD (generalized anxiety disorder)      Social History   Socioeconomic History  . Marital status: Single    Spouse name: Not on file  . Number of children: Not on file  . Years of education: Not on file  . Highest education level: Not on file  Occupational History  . Not on file  Social Needs  . Financial resource strain: Not on file  . Food insecurity    Worry: Not on file    Inability: Not on file  . Transportation needs    Medical: Not on file    Non-medical: Not on file  Tobacco Use  . Smoking status: Never Smoker  . Smokeless tobacco: Never Used  Substance and Sexual Activity  . Alcohol use: Yes    Alcohol/week: 0.0 standard drinks    Comment: occasional  . Drug use: No  . Sexual activity: Yes    Partners: Male    Birth control/protection: Pill  Lifestyle  . Physical activity    Days per week: Not on file    Minutes per session: Not on file  . Stress: Not on file  Relationships  . Social Herbalist on phone: Not on file    Gets together: Not on file    Attends religious service: Not on file    Active  member of club or organization: Not on file    Attends meetings of clubs or organizations: Not on file    Relationship status: Not on file  . Intimate partner violence    Fear of current or ex partner: Not on file    Emotionally abused: Not on file    Physically abused: Not on file    Forced sexual activity: Not on file  Other Topics Concern  . Not on file  Social History Narrative   Divorced   Employed as a Dance movement psychotherapist   No children.   Enjoys exercising, spending time with friends.    Past Surgical History:  Procedure Laterality Date  . BREAST ENHANCEMENT SURGERY  10-04-2000  . TONSILECTOMY, ADENOIDECTOMY, BILATERAL MYRINGOTOMY AND TUBES      Family History  Problem Relation Age of Onset  . Hearing loss Maternal Grandmother   . Cancer Maternal Grandmother        Liver cancer  . Thyroid nodules Mother   . Cancer Maternal Aunt 10-04-2044       Breast cancer- died at 110    No Known Allergies  Current Outpatient Medications on File Prior to Visit  Medication Sig Dispense Refill  . Cholecalciferol (D3 ADULT PO) Take 5,000 mg by mouth daily.    . Multiple Vitamins-Minerals (MULTIVITAMIN WITH MINERALS) tablet Take 1 tablet by mouth daily.      . sertraline (ZOLOFT) 50 MG tablet Take 1 tablet (50 mg total) by mouth daily. For anxiety. 90 tablet 3   No current facility-administered medications on file prior to visit.     BP 116/76   Pulse (!) 59   Temp 98.7 F (37.1 C) (Temporal)   Ht 5' 5.5" (1.664 m)   Wt 135 lb 12 oz (61.6 kg)   SpO2 98%   BMI 22.25 kg/m    Objective:   Physical Exam  Constitutional: She appears well-nourished.  Genitourinary: There is no tenderness or lesion on the right labia. There is no tenderness or lesion on the left labia. Cervix exhibits discharge. Cervix exhibits no motion tenderness. Right adnexum displays no tenderness. Left adnexum displays no tenderness.    No vaginal discharge or erythema.  No erythema in the vagina.    Genitourinary  Comments: Scant amount of whitish/clear vaginal discharge.  No foul smell.   Skin: Skin is warm and dry.           Assessment & Plan:

## 2019-01-08 NOTE — Assessment & Plan Note (Signed)
Exam today unremarkable. Pap smear collected and pending. Patient to be scheduling mammogram.

## 2019-01-11 DIAGNOSIS — Z1239 Encounter for other screening for malignant neoplasm of breast: Secondary | ICD-10-CM

## 2019-01-12 LAB — CYTOLOGY - PAP
Diagnosis: NEGATIVE
HPV: NOT DETECTED

## 2019-01-21 ENCOUNTER — Other Ambulatory Visit: Payer: Self-pay

## 2019-01-21 ENCOUNTER — Ambulatory Visit (INDEPENDENT_AMBULATORY_CARE_PROVIDER_SITE_OTHER): Payer: PRIVATE HEALTH INSURANCE | Admitting: Vascular Surgery

## 2019-01-21 ENCOUNTER — Encounter (INDEPENDENT_AMBULATORY_CARE_PROVIDER_SITE_OTHER): Payer: Self-pay | Admitting: Vascular Surgery

## 2019-01-21 DIAGNOSIS — I8312 Varicose veins of left lower extremity with inflammation: Secondary | ICD-10-CM

## 2019-01-21 DIAGNOSIS — I8311 Varicose veins of right lower extremity with inflammation: Secondary | ICD-10-CM | POA: Diagnosis not present

## 2019-01-21 NOTE — Progress Notes (Signed)
MRN : [id number]  Mary Mason is a 48 y.o. ([birth year]) female who presents with chief complaint of  Chief Complaint  Patient presents with  . New Patient (Initial Visit)  .  History of Present Illness:   The patient is seen for evaluation of symptomatic varicose veins. The patient relates burning and stinging which worsened steadily throughout the course of the day, particularly with standing. The patient also notes an aching and throbbing pain over the varicosities, particularly with prolonged dependent positions. The symptoms are significantly improved with elevation.  The patient also notes that during hot weather the symptoms are greatly intensified. The patient states the pain from the varicose veins interferes with work, daily exercise, shopping and household maintenance. At this point, the symptoms are persistent and severe enough that they're having a negative impact on lifestyle and are interfering with daily activities.  There is no history of prior surgical intervention but she describes sclerotherapy of the right anterior saphenous vein.  There is no history of DVT, PE or superficial thrombophlebitis. There is no history of ulceration or hemorrhage. The patient denies a significant family history of varicose veins.  The patient has not worn graduated compression in the past. At the present time the patient has not been using over-the-counter analgesics.    Current Meds  Medication Sig  . Cholecalciferol (D3 ADULT PO) Take 5,000 mg by mouth daily.  . Levonorgestrel-Ethinyl Estradiol (ASHLYNA) 0.15-0.03 &0.01 MG tablet Take 1 tablet by mouth daily.  . Multiple Vitamins-Minerals (MULTIVITAMIN WITH MINERALS) tablet Take 1 tablet by mouth daily.    . sertraline (ZOLOFT) 50 MG tablet Take 1 tablet (50 mg total) by mouth daily. For anxiety.    Past Medical History:  Diagnosis Date  . Arthritis   . Cellulitis   . Chilblain lupus erythematosus   . Chilblains   .  Depression   . GAD (generalized anxiety disorder)     Past Surgical History:  Procedure Laterality Date  . BREAST ENHANCEMENT SURGERY  2002  . TONSILECTOMY, ADENOIDECTOMY, BILATERAL MYRINGOTOMY AND TUBES      Social History Social History   Tobacco Use  . Smoking status: Never Smoker  . Smokeless tobacco: Never Used  Substance Use Topics  . Alcohol use: Yes    Alcohol/week: 0.0 standard drinks    Comment: occasional  . Drug use: No    Family History Family History  Problem Relation Age of Onset  . Hearing loss Maternal Grandmother   . Cancer Maternal Grandmother        Liver cancer  . Thyroid nodules Mother   . Cancer Maternal Aunt 58       Breast cancer- died at 28  No family history of bleeding/clotting disorders, porphyria or autoimmune disease   No Known Allergies   REVIEW OF SYSTEMS (Negative unless checked)  Constitutional: [] Weight loss  [] Fever  [] Chills Cardiac: [] Chest pain   [] Chest pressure   [] Palpitations   [] Shortness of breath when laying flat   [] Shortness of breath with exertion. Vascular:  [] Pain in legs with walking   [x] Pain in legs at rest  [] History of DVT   [] Phlebitis   [] Swelling in legs   [x] Varicose veins   [] Non-healing ulcers Pulmonary:   [] Uses home oxygen   [] Productive cough   [] Hemoptysis   [] Wheeze  [] COPD   [] Asthma Neurologic:  [] Dizziness   [] Seizures   [] History of stroke   [] History of TIA  [] Aphasia   [] Vissual changes   [] Weakness  or numbness in arm   [] Weakness or numbness in leg Musculoskeletal:   [] Joint swelling   [] Joint pain   [] Low back pain Hematologic:  [] Easy bruising  [] Easy bleeding   [] Hypercoagulable state   [] Anemic Gastrointestinal:  [] Diarrhea   [] Vomiting  [] Gastroesophageal reflux/heartburn   [] Difficulty swallowing. Genitourinary:  [] Chronic kidney disease   [] Difficult urination  [] Frequent urination   [] Blood in urine Skin:  [] Rashes   [] Ulcers  Psychological:  [] History of anxiety   []  History of major  depression.  Physical Examination  Vitals:   01/21/19 1334  BP: 122/78  Pulse: 66  Resp: 10  Weight: 138 lb (62.6 kg)  Height: 5\' 6"  (1.676 m)   Body mass index is 22.27 kg/m. Gen: WD/WN, NAD Head: [place]/AT, No temporalis wasting.  Ear/Nose/Throat: Hearing grossly intact, nares w/o erythema or drainage, poor dentition Eyes: PER, EOMI, sclera nonicteric.  Neck: Supple, no masses.  No bruit or JVD.  Pulmonary:  Good air movement, clear to auscultation bilaterally, no use of accessory muscles.  Cardiac: RRR, normal S1, S2, no Murmurs. Vascular: Large varicosities present extensively 4-6 mm bilaterally.  Mild venous stasis changes to the legs bilaterally.  1+ soft pitting edema Vessel Right Left  PT Palpable Palpable  DP Palpable Palpable  Gastrointestinal: soft, non-distended. No guarding/no peritoneal signs.  Musculoskeletal: M/S 5/5 throughout.  No deformity or atrophy.  Neurologic: CN 2-12 intact. Pain and light touch intact in extremities.  Symmetrical.  Speech is fluent. Motor exam as listed above. Psychiatric: Judgment intact, Mood & affect appropriate for pt's clinical situation. Dermatologic: No rashes or ulcers noted.  No changes consistent with cellulitis. Lymph : No Cervical lymphadenopathy, no lichenification or skin changes of chronic lymphedema.  CBC Lab Results  Component Value Date   WBC 6.0 05/19/2018   HGB 14.4 05/19/2018   HCT 42.1 05/19/2018   MCV 94.9 05/19/2018   PLT 278.0 05/19/2018    BMET    Component Value Date/Time   NA 136 05/19/2018 1429   K 4.6 05/19/2018 1429   CL 103 05/19/2018 1429   CO2 27 05/19/2018 1429   GLUCOSE 106 (H) 05/19/2018 1429   BUN 20 05/19/2018 1429   CREATININE 0.94 05/19/2018 1429   CALCIUM 9.2 05/19/2018 1429   CrCl cannot be calculated (Patient's most recent lab result is older than the maximum 21 days allowed.).  COAG No results found for: INR, PROTIME  Radiology No results found.   Assessment/Plan 1.  Varicose veins of both lower extremities with inflammation Recommend:  The patient is complaining of varicose veins.    I have had a long discussion with the patient regarding  varicose veins and why they cause symptoms.  Patient will begin wearing graduated compression stockings on a daily basis, beginning first thing in the morning and removing them in the evening. The patient is instructed specifically not to sleep in the stockings.    The patient  will also begin using over-the-counter analgesics such as Motrin 600 mg po TID to help control the symptoms as needed.    In addition, behavioral modification including elevation during the day will be initiated, utilizing a recliner was recommended.  The patient is also instructed to continue exercising such as walking 4-5 times per week.  At this time the patient wishes to continue conservative therapy and is not interested in more invasive treatments such as laser ablation and sclerotherapy.  The Patient will follow up PRN if the symptoms worsen.    Hortencia Pilar,  MD  01/21/2019 1:42 PM

## 2019-02-17 ENCOUNTER — Ambulatory Visit
Admission: RE | Admit: 2019-02-17 | Discharge: 2019-02-17 | Disposition: A | Discharge status: Home / Self Care | Payer: PRIVATE HEALTH INSURANCE | Source: Ambulatory Visit | Attending: Primary Care | Admitting: Primary Care

## 2019-02-17 ENCOUNTER — Other Ambulatory Visit: Payer: Self-pay | Admitting: Primary Care

## 2019-02-17 DIAGNOSIS — Z1231 Encounter for screening mammogram for malignant neoplasm of breast: Secondary | ICD-10-CM | POA: Diagnosis present

## 2019-02-17 DIAGNOSIS — Z1239 Encounter for other screening for malignant neoplasm of breast: Secondary | ICD-10-CM

## 2019-03-15 ENCOUNTER — Telehealth: Payer: Self-pay

## 2019-03-15 ENCOUNTER — Encounter: Payer: Self-pay | Admitting: Family Medicine

## 2019-03-15 NOTE — Telephone Encounter (Signed)
Pt said for 2 wks on and off both ears feel stopped up; pt has tried sudafed; also on and off for 2 wks dizziness when gets up or goes down quickly(pt teaches zumba) on last for short period. Offered pt multiple appts but pt scheduled 03/17/19 at 32 Am with Dr Glori Bickers due to pts schedule at work. ED and UC precautions given and pt voiced understanding.

## 2019-03-15 NOTE — Telephone Encounter (Signed)
I will see her then  

## 2019-03-16 ENCOUNTER — Ambulatory Visit: Payer: PRIVATE HEALTH INSURANCE | Admitting: Family Medicine

## 2019-03-17 ENCOUNTER — Encounter: Payer: Self-pay | Admitting: Family Medicine

## 2019-03-17 ENCOUNTER — Other Ambulatory Visit: Payer: Self-pay

## 2019-03-17 ENCOUNTER — Ambulatory Visit (INDEPENDENT_AMBULATORY_CARE_PROVIDER_SITE_OTHER): Payer: PRIVATE HEALTH INSURANCE | Admitting: Family Medicine

## 2019-03-17 VITALS — BP 122/78 | HR 69 | Temp 97.7°F | Ht 66.0 in | Wt 140.2 lb

## 2019-03-17 DIAGNOSIS — Z23 Encounter for immunization: Secondary | ICD-10-CM

## 2019-03-17 DIAGNOSIS — H6980 Other specified disorders of Eustachian tube, unspecified ear: Secondary | ICD-10-CM | POA: Insufficient documentation

## 2019-03-17 DIAGNOSIS — H6983 Other specified disorders of Eustachian tube, bilateral: Secondary | ICD-10-CM | POA: Diagnosis not present

## 2019-03-17 DIAGNOSIS — R42 Dizziness and giddiness: Secondary | ICD-10-CM | POA: Insufficient documentation

## 2019-03-17 MED ORDER — FLUTICASONE PROPIONATE 50 MCG/ACT NA SUSP: 2.0000 | Freq: Every day | NASAL | 6 refills | Status: DC

## 2019-03-17 NOTE — Assessment & Plan Note (Signed)
Suspect causing ear discomfort and dizziness Px flonase Bid for 3 d then daily through allergy season  Watch for ear pain or sinus pain  Update if not starting to improve in a week or if worsening

## 2019-03-17 NOTE — Patient Instructions (Addendum)
I think you have eustachian tube dysfunction causing some mild vertigo   Use flonase twice daily for 3 days then once daily through allergy season  Change position slowly   Dramamine is ok if worse dizziness  If ear pain or sinus pain or fever - call and let us know   Update if not starting to improve in a week or if worsening

## 2019-03-17 NOTE — Assessment & Plan Note (Signed)
Suspect due to ETD Mild  Disc imp of change position slowly  Handout given on BPV and the Epely maneuver  Recommend dramamine if symptoms suddenly worsen (or can send in meclizine)  Update if not starting to improve in a week or if worsening

## 2019-03-17 NOTE — Progress Notes (Signed)
Subjective:    Patient ID: Mary Mason, female    DOB: Sep 16, 1970, 48 y.o.   MRN: [id number]  HPI 48 yo pt of NP Clark here for ear fullness (bilat) and dizziness   Had sinus trouble 2 wk ago  Ears were very full- like in a tunnel/hearing Got dizzy on and off  Got a little better Now waxes and wanes -not severe but bothersome  Head feels like she is in a cloud  Nauseated at the very beginning   Changes in position make her feel like she is spinning (for example if looking up from the floor )   No fever  No purulent nasal d/c (not much d/c at all)  Has mild seasonal allergies  occ sinus headache if outside (not right now) -one side or the other    Zyrtec  Sudafed  Tried since Friday - not very helpful   Also desires flu shot   Wt Readings from Last 3 Encounters:  03/17/19 140 lb 4 oz (63.6 kg)  01/21/19 138 lb (62.6 kg)  01/08/19 135 lb 12 oz (61.6 kg)   22.64 kg/m   BP Readings from Last 3 Encounters:  03/17/19 122/78  01/21/19 122/78  01/08/19 116/76   Pulse Readings from Last 3 Encounters:  03/17/19 69  01/21/19 66  01/08/19 (!) 59    Patient Active Problem List   Diagnosis Date Noted  . ETD (eustachian tube dysfunction) 03/17/2019  . Vertigo 03/17/2019  . Varicose veins of both lower extremities with inflammation 01/21/2019  . Screening for cervical cancer 01/08/2019  . GAD (generalized anxiety disorder) 03/24/2017  . Routine general medical examination at a health care facility 10/23/2015  . Pain, joint, multiple sites 10/23/2015  . Abnormal cervical Papanicolaou smear 06/16/2012  . Fatigue 06/11/2011  . 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 Arthritis   . Cellulitis   . Chilblain lupus erythematosus   . Chilblains   . Depression   . GAD (generalized anxiety disorder)    Past Surgical History:  Procedure Laterality Date  . AUGMENTATION MAMMAPLASTY    . BREAST ENHANCEMENT SURGERY  2002  . TONSILECTOMY,  ADENOIDECTOMY, BILATERAL MYRINGOTOMY AND TUBES     Social History   Tobacco Use  . Smoking status: Never Smoker  . Smokeless tobacco: Never Used  Substance Use Topics  . Alcohol use: Yes    Alcohol/week: 0.0 standard drinks    Comment: occasional  . Drug use: No   Family History  Problem Relation Age of Onset  . Hearing loss Maternal Grandmother   . Cancer Maternal Grandmother        Liver cancer  . Thyroid nodules Mother   . Cancer Maternal Aunt 75       Breast cancer- died at 66  . Breast cancer Maternal Aunt   . Breast cancer Cousin    No Known Allergies Current Outpatient Medications on File Prior to Visit  Medication Sig Dispense Refill  . cetirizine (ZYRTEC) 10 MG tablet Take 10 mg by mouth daily.    . Cholecalciferol (D3 ADULT PO) Take 5,000 mg by mouth daily.    . Levonorgestrel-Ethinyl Estradiol (ASHLYNA) 0.15-0.03 &0.01 MG tablet Take 1 tablet by mouth daily. 1 Package 3  . Multiple Vitamins-Minerals (MULTIVITAMIN WITH MINERALS) tablet Take 1 tablet by mouth daily.      . phenylephrine (SUDAFED PE) 10 MG TABS tablet Take 10 mg by mouth every 4 (four) hours as needed.    Marland Kitchen  sertraline (ZOLOFT) 50 MG tablet Take 1 tablet (50 mg total) by mouth daily. For anxiety. 90 tablet 3   No current facility-administered medications on file prior to visit.     Review of Systems  Constitutional: Negative for activity change, appetite change, fatigue, fever and unexpected weight change.  HENT: Negative for congestion, ear pain, rhinorrhea, sinus pressure and sore throat.   Eyes: Negative for pain, redness and visual disturbance.  Respiratory: Negative for cough, shortness of breath and wheezing.   Cardiovascular: Negative for chest pain and palpitations.  Gastrointestinal: Negative for abdominal pain, blood in stool, constipation and diarrhea.  Endocrine: Negative for polydipsia and polyuria.  Genitourinary: Negative for dysuria, frequency and urgency.  Musculoskeletal: Negative  for arthralgias, back pain and myalgias.  Skin: Negative for pallor and rash.  Allergic/Immunologic: Negative for environmental allergies.  Neurological: Positive for dizziness. Negative for tremors, seizures, syncope, facial asymmetry, speech difficulty, weakness, light-headedness, numbness and headaches.  Hematological: Negative for adenopathy. Does not bruise/bleed easily.  Psychiatric/Behavioral: Negative for decreased concentration and dysphoric mood. The patient is not nervous/anxious.        Objective:   Physical Exam Constitutional:      General: She is not in acute distress.    Appearance: Normal appearance. She is well-developed and normal weight. She is not ill-appearing or diaphoretic.  HENT:     Head: Normocephalic and atraumatic.     Right Ear: Ear canal and external ear normal.     Left Ear: Ear canal and external ear normal.     Ears:     Comments: Dull TMs bilaterally  Scant cerumen R canal  No erythema or drainage Grossly nl hearing     Nose: Nose normal.     Comments: Boggy nares  No sinus tenderness    Mouth/Throat:     Mouth: Mucous membranes are moist.     Pharynx: Oropharynx is clear. No posterior oropharyngeal erythema.  Eyes:     General:        Right eye: No discharge.        Left eye: No discharge.     Conjunctiva/sclera: Conjunctivae normal.     Pupils: Pupils are equal, round, and reactive to light.     Comments: Horizontal nystagmus 2-3 beats bilat  Neck:     Musculoskeletal: Normal range of motion and neck supple. No neck rigidity or muscular tenderness.     Thyroid: No thyromegaly.     Vascular: No carotid bruit or JVD.  Cardiovascular:     Rate and Rhythm: Normal rate and regular rhythm.     Heart sounds: Normal heart sounds. No murmur. No gallop.   Pulmonary:     Effort: Pulmonary effort is normal. No respiratory distress.     Breath sounds: Normal breath sounds. No wheezing or rales.  Abdominal:     General: Bowel sounds are normal.  There is no distension or abdominal bruit.     Palpations: Abdomen is soft. There is no mass.     Tenderness: There is no abdominal tenderness.  Lymphadenopathy:     Cervical: No cervical adenopathy.  Skin:    General: Skin is warm and dry.     Coloration: Skin is not pale.     Findings: No rash.  Neurological:     Mental Status: She is alert.     Cranial Nerves: Cranial nerves are intact. No dysarthria or facial asymmetry.     Sensory: Sensation is intact.     Motor: Motor  function is intact. No tremor.     Coordination: Romberg sign negative. Finger-Nose-Finger Test normal.     Gait: Gait is intact.     Deep Tendon Reflexes: Reflexes are normal and symmetric.     Comments: No focal cerebellar signs   Psychiatric:        Mood and Affect: Mood normal.        Cognition and Memory: Cognition and memory normal.           Assessment & Plan:   Problem List Items Addressed This Visit      Nervous and Auditory   ETD (eustachian tube dysfunction) - Primary    Suspect causing ear discomfort and dizziness Px flonase Bid for 3 d then daily through allergy season  Watch for ear pain or sinus pain  Update if not starting to improve in a week or if worsening          Other   Vertigo    Suspect due to ETD Mild  Disc imp of change position slowly  Handout given on BPV and the Epely maneuver  Recommend dramamine if symptoms suddenly worsen (or can send in meclizine)  Update if not starting to improve in a week or if worsening         Other Visit Diagnoses    Need for influenza vaccination       Relevant Orders   Flu Vaccine QUAD 6+ mos PF IM (Fluarix Quad PF) (Completed)

## 2019-03-22 ENCOUNTER — Encounter: Payer: Self-pay | Admitting: Family Medicine

## 2019-03-22 MED ORDER — MECLIZINE HCL 25 MG PO TABS: 25.0000 mg | ORAL_TABLET | Freq: Three times a day (TID) | ORAL | 1 refills | Status: DC | PRN

## 2019-03-30 ENCOUNTER — Encounter: Payer: Self-pay | Admitting: Family Medicine

## 2019-03-30 DIAGNOSIS — R42 Dizziness and giddiness: Secondary | ICD-10-CM

## 2019-03-30 NOTE — Telephone Encounter (Signed)
Referral to ENT for vertigo Please call her  Thanks

## 2019-04-01 ENCOUNTER — Encounter: Payer: Self-pay | Admitting: Family Medicine

## 2019-04-01 ENCOUNTER — Telehealth: Payer: Self-pay | Admitting: Primary Care

## 2019-04-01 NOTE — Telephone Encounter (Signed)
Patient called about her referral She would like a [place] location that is [place] since that is what he insurance will cover

## 2019-04-02 NOTE — Telephone Encounter (Signed)
Mary Mason, are you able to help patient since [name] is not here today

## 2019-04-07 ENCOUNTER — Other Ambulatory Visit: Payer: Self-pay

## 2019-04-07 ENCOUNTER — Ambulatory Visit (INDEPENDENT_AMBULATORY_CARE_PROVIDER_SITE_OTHER): Payer: PRIVATE HEALTH INSURANCE | Admitting: Primary Care

## 2019-04-07 ENCOUNTER — Encounter: Payer: Self-pay | Admitting: Primary Care

## 2019-04-07 VITALS — BP 120/70 | HR 58 | Temp 98.1°F | Ht 65.5 in | Wt 139.2 lb

## 2019-04-07 DIAGNOSIS — R42 Dizziness and giddiness: Secondary | ICD-10-CM | POA: Diagnosis not present

## 2019-04-07 DIAGNOSIS — J3489 Other specified disorders of nose and nasal sinuses: Secondary | ICD-10-CM | POA: Diagnosis not present

## 2019-04-07 MED ORDER — CETIRIZINE HCL 10 MG PO TABS: 10.0000 mg | ORAL_TABLET | Freq: Every day | ORAL | 0 refills | Status: DC

## 2019-04-07 MED ORDER — AMOXICILLIN-POT CLAVULANATE 875-125 MG PO TABS: 1.0000 | ORAL_TABLET | Freq: Two times a day (BID) | ORAL | 0 refills | Status: DC

## 2019-04-07 NOTE — Assessment & Plan Note (Signed)
Resolved spontaneously 2 days ago. Continue to monitor.

## 2019-04-07 NOTE — Assessment & Plan Note (Signed)
Acute now for the last 6 weeks without improvement from OTC treatment.  She cannot afford to see ENT as there is no one within her network locally.  Suspect she may need sinus CT scan at some point.  Given continued symptoms without improvement with OTC treatment, persistent pressure to left maxillary and frontal sinus region we will start with a 10-day course of Augmentin.  She agrees.  Continue Zyrtec and Flonase.  She will update in 1 week.  Consider CT sinus scan if no improvement.

## 2019-04-07 NOTE — Progress Notes (Signed)
Subjective:    Patient ID: Mary Mason, female    DOB: 1971/05/24, 48 y.o.   MRN: [id number]  HPI  Mary Mason is a 48 year old female with a history of eustachian tube dysfunction, vertigo who presents today with a chief complaint of dizziness and sinus headache.   She was evaluated by Dr. Glori Bickers on 03/17/19 for a two week history of sinus headache, ear fullness, dizziness, nausea.  She had tried Zyrtec, Sudafed without improvement.  She was diagnosed with eustachian tube dysfunction and prescribed Flonase with recommendations of Dramamine.  She was told to follow-up in 1 week if no improvement.  Since her visit with Dr. Glori Bickers she's continued to feel symptoms of dizziness, bilateral ear fullness, left-sided sinus pressure with headache to both frontal and maxillary sinus region, teeth discomfort with running, facial pressure when bending forward. She's taken Advil, and OTC sinus medication, Flonase, Zyrtec without improvement. She did preform Epley's Maneuvers last week without improvement. She took a Electrical engineer last night.   She woke up two mornings ago and her vertigo/dizziness had resolved.  She continues to experience the other symptoms mentioned above.  She saw an audiologist two days ago who didn't notice any abnormality. She went to her chiropractor yesterday who did some neck adjustments.   She denies fevers, rhinorrhea, purulent mucus from nasal cavity.  She cannot blow anything out of her nasal cavity at this point.  Review of Systems  Constitutional: Negative for fever.  HENT: Positive for congestion, sinus pressure and sinus pain. Negative for ear pain and rhinorrhea.        Dental pressure.  Respiratory: Negative for cough and shortness of breath.   Allergic/Immunologic: Positive for environmental allergies.  Neurological: Positive for headaches. Negative for dizziness.       Past Medical History:  Diagnosis Date  . Arthritis   . Cellulitis   . Chilblain lupus  erythematosus   . Chilblains   . Depression   . GAD (generalized anxiety disorder)      Social History   Socioeconomic History  . Marital status: Single    Spouse name: Not on file  . Number of children: Not on file  . Years of education: Not on file  . Highest education level: Not on file  Occupational History  . Not on file  Social Needs  . Financial resource strain: Not on file  . Food insecurity    Worry: Not on file    Inability: Not on file  . Transportation needs    Medical: Not on file    Non-medical: Not on file  Tobacco Use  . Smoking status: Never Smoker  . Smokeless tobacco: Never Used  Substance and Sexual Activity  . Alcohol use: Yes    Alcohol/week: 0.0 standard drinks    Comment: occasional  . Drug use: No  . Sexual activity: Yes    Partners: Male    Birth control/protection: Pill  Lifestyle  . Physical activity    Days per week: Not on file    Minutes per session: Not on file  . Stress: Not on file  Relationships  . Social Herbalist on phone: Not on file    Gets together: Not on file    Attends religious service: Not on file    Active member of club or organization: Not on file    Attends meetings of clubs or organizations: Not on file    Relationship status: Not on file  .  Intimate partner violence    Fear of current or ex partner: Not on file    Emotionally abused: Not on file    Physically abused: Not on file    Forced sexual activity: Not on file  Other Topics Concern  . Not on file  Social History Narrative   Divorced   Employed as a Dance movement psychotherapist   No children.   Enjoys exercising, spending time with friends.    Past Surgical History:  Procedure Laterality Date  . AUGMENTATION MAMMAPLASTY    . BREAST ENHANCEMENT SURGERY  2002  . TONSILECTOMY, ADENOIDECTOMY, BILATERAL MYRINGOTOMY AND TUBES      Family History  Problem Relation Age of Onset  . Hearing loss Maternal Grandmother   . Cancer Maternal  Grandmother        Liver cancer  . Thyroid nodules Mother   . Cancer Maternal Aunt 25       Breast cancer- died at 21  . Breast cancer Maternal Aunt   . Breast cancer Cousin     No Known Allergies  Current Outpatient Medications on File Prior to Visit  Medication Sig Dispense Refill  . Cholecalciferol (D3 ADULT PO) Take 5,000 mg by mouth daily.    . fluticasone (FLONASE) 50 MCG/ACT nasal spray Place 2 sprays into both nostrils daily. 16 g 6  . Levonorgestrel-Ethinyl Estradiol (ASHLYNA) 0.15-0.03 &0.01 MG tablet Take 1 tablet by mouth daily. 1 Package 3  . meclizine (ANTIVERT) 25 MG tablet Take 1 tablet (25 mg total) by mouth 3 (three) times daily as needed for dizziness or nausea. 30 tablet 1  . Multiple Vitamins-Minerals (MULTIVITAMIN WITH MINERALS) tablet Take 1 tablet by mouth daily.      . phenylephrine (SUDAFED PE) 10 MG TABS tablet Take 10 mg by mouth every 4 (four) hours as needed.    . sertraline (ZOLOFT) 50 MG tablet Take 1 tablet (50 mg total) by mouth daily. For anxiety. 90 tablet 3   No current facility-administered medications on file prior to visit.     BP 120/70   Pulse (!) 58   Temp 98.1 F (36.7 C) (Temporal)   Ht 5' 5.5" (1.664 m)   Wt 139 lb 4 oz (63.2 kg)   SpO2 98%   BMI 22.82 kg/m    Objective:   Physical Exam  Constitutional: She appears well-nourished. She does not appear ill.  HENT:  Right Ear: Tympanic membrane and ear canal normal.  Left Ear: Tympanic membrane and ear canal normal.  Nose: Right sinus exhibits no maxillary sinus tenderness and no frontal sinus tenderness. Left sinus exhibits no maxillary sinus tenderness and no frontal sinus tenderness.  Cardiovascular: Normal rate and regular rhythm.  Respiratory: Effort normal and breath sounds normal.  Skin: Skin is warm and dry.           Assessment & Plan:

## 2019-04-07 NOTE — Patient Instructions (Signed)
Start Augmentin antibiotics for the infection Take 1 tablet by mouth twice daily for 10 days.  Continue taking Zyrtec once daily. Use Flonase as needed.  Please notify me if no improvement after one week.  It was a pleasure to see you today!

## 2019-05-18 DIAGNOSIS — Z0000 Encounter for general adult medical examination without abnormal findings: Secondary | ICD-10-CM

## 2019-05-21 ENCOUNTER — Ambulatory Visit (INDEPENDENT_AMBULATORY_CARE_PROVIDER_SITE_OTHER): Payer: PRIVATE HEALTH INSURANCE | Admitting: Primary Care

## 2019-05-21 ENCOUNTER — Other Ambulatory Visit: Payer: Self-pay

## 2019-05-21 VITALS — BP 114/72 | HR 74 | Temp 97.4°F | Ht 65.5 in | Wt 137.5 lb

## 2019-05-21 DIAGNOSIS — T691XXD Chilblains, subsequent encounter: Secondary | ICD-10-CM | POA: Diagnosis not present

## 2019-05-21 DIAGNOSIS — F411 Generalized anxiety disorder: Secondary | ICD-10-CM

## 2019-05-21 DIAGNOSIS — Z0000 Encounter for general adult medical examination without abnormal findings: Secondary | ICD-10-CM

## 2019-05-21 DIAGNOSIS — M2550 Pain in unspecified joint: Secondary | ICD-10-CM

## 2019-05-21 DIAGNOSIS — R42 Dizziness and giddiness: Secondary | ICD-10-CM

## 2019-05-21 DIAGNOSIS — Z1211 Encounter for screening for malignant neoplasm of colon: Secondary | ICD-10-CM

## 2019-05-21 NOTE — Assessment & Plan Note (Signed)
Immunizations UTD. Pap smear UTD. Mammogram UTD. Commended her on a healthy diet with regular exercise. Exam today unremarkable. Labs pending.

## 2019-05-21 NOTE — Assessment & Plan Note (Signed)
Doing well on Zoloft, Denies SI/HI. Continue current regimen.

## 2019-05-21 NOTE — Assessment & Plan Note (Signed)
Resolved after ENT visits.

## 2019-05-21 NOTE — Patient Instructions (Signed)
Stop by the lab prior to leaving today. I will notify you of your results once received.   Continue exercising. You should be getting 150 minutes of moderate intensity exercise weekly.  Continue to eat a healthy diet. Ensure you are consuming 64 ounces of water daily.  You will be contacted regarding your referral to GI for the colonoscopy.  Please let us know if you have not been contacted within two weeks.   It was a pleasure to see you today!   Preventive Care 29-71 Years Old, Female Preventive care refers to visits with your health care provider and lifestyle choices that can promote health and wellness. This includes:  A yearly physical exam. This may also be called an annual well check.  Regular dental visits and eye exams.  Immunizations.  Screening for certain conditions.  Healthy lifestyle choices, such as eating a healthy diet, getting regular exercise, not using drugs or products that contain nicotine and tobacco, and limiting alcohol use. What can I expect for my preventive care visit? Physical exam Your health care provider will check your:  Height and weight. This may be used to calculate body mass index (BMI), which tells if you are at a healthy weight.  Heart rate and blood pressure.  Skin for abnormal spots. Counseling Your health care provider may ask you questions about your:  Alcohol, tobacco, and drug use.  Emotional well-being.  Home and relationship well-being.  Sexual activity.  Eating habits.  Work and work Statistician.  Method of birth control.  Menstrual cycle.  Pregnancy history. What immunizations do I need?  Influenza (flu) vaccine  This is recommended every year. Tetanus, diphtheria, and pertussis (Tdap) vaccine  You may need a Td booster every 10 years. Varicella (chickenpox) vaccine  You may need this if you have not been vaccinated. Zoster (shingles) vaccine  You may need this after age 78. Measles, mumps, and rubella  (MMR) vaccine  You may need at least one dose of MMR if you were born in 1957 or later. You may also need a second dose. Pneumococcal conjugate (PCV13) vaccine  You may need this if you have certain conditions and were not previously vaccinated. Pneumococcal polysaccharide (PPSV23) vaccine  You may need one or two doses if you smoke cigarettes or if you have certain conditions. Meningococcal conjugate (MenACWY) vaccine  You may need this if you have certain conditions. Hepatitis A vaccine  You may need this if you have certain conditions or if you travel or work in places where you may be exposed to hepatitis A. Hepatitis B vaccine  You may need this if you have certain conditions or if you travel or work in places where you may be exposed to hepatitis B. Haemophilus influenzae type b (Hib) vaccine  You may need this if you have certain conditions. Human papillomavirus (HPV) vaccine  If recommended by your health care provider, you may need three doses over 6 months. You may receive vaccines as individual doses or as more than one vaccine together in one shot (combination vaccines). Talk with your health care provider about the risks and benefits of combination vaccines. What tests do I need? Blood tests  Lipid and cholesterol levels. These may be checked every 5 years, or more frequently if you are over 32 years old.  Hepatitis C test.  Hepatitis B test. Screening  Lung cancer screening. You may have this screening every year starting at age 74 if you have a 30-pack-year history of smoking and  currently smoke or have quit within the past 15 years.  Colorectal cancer screening. All adults should have this screening starting at age 37 and continuing until age 12. Your health care provider may recommend screening at age 58 if you are at increased risk. You will have tests every 1-10 years, depending on your results and the type of screening test.  Diabetes screening. This is done  by checking your blood sugar (glucose) after you have not eaten for a while (fasting). You may have this done every 1-3 years.  Mammogram. This may be done every 1-2 years. Talk with your health care provider about when you should start having regular mammograms. This may depend on whether you have a family history of breast cancer.  BRCA-related cancer screening. This may be done if you have a family history of breast, ovarian, tubal, or peritoneal cancers.  Pelvic exam and Pap test. This may be done every 3 years starting at age 76. Starting at age 59, this may be done every 5 years if you have a Pap test in combination with an HPV test. Other tests  Sexually transmitted disease (STD) testing.  Bone density scan. This is done to screen for osteoporosis. You may have this scan if you are at high risk for osteoporosis. Follow these instructions at home: Eating and drinking  Eat a diet that includes fresh fruits and vegetables, whole grains, lean protein, and low-fat dairy.  Take vitamin and mineral supplements as recommended by your health care provider.  Do not drink alcohol if: ? Your health care provider tells you not to drink. ? You are pregnant, may be pregnant, or are planning to become pregnant.  If you drink alcohol: ? Limit how much you have to 0-1 drink a day. ? Be aware of how much alcohol is in your drink. In the [place] one drink equals one 12 oz bottle of beer (355 mL), one 5 oz glass of wine (148 mL), or one 1 oz glass of hard liquor (44 mL). Lifestyle  Take daily care of your teeth and gums.  Stay active. Exercise for at least 30 minutes on 5 or more days each week.  Do not use any products that contain nicotine or tobacco, such as cigarettes, e-cigarettes, and chewing tobacco. If you need help quitting, ask your health care provider.  If you are sexually active, practice safe sex. Use a condom or other form of birth control (contraception) in order to prevent  pregnancy and STIs (sexually transmitted infections).  If told by your health care provider, take low-dose aspirin daily starting at age 34. What's next?  Visit your health care provider once a year for a well check visit.  Ask your health care provider how often you should have your eyes and teeth checked.  Stay up to date on all vaccines. This information is not intended to replace advice given to you by your health care provider. Make sure you discuss any questions you have with your health care provider. Document Released: 07/14/2015 Document Revised: 02/26/2018 Document Reviewed: 02/26/2018 Elsevier Patient Education  2020 [employer].

## 2019-05-21 NOTE — Progress Notes (Signed)
Subjective:    Patient ID: Mary Mason, female    DOB: [birth year], 48 y.o.   MRN: [id number]  HPI  Mary Mason is a 48 year old female who presents today for complete physical.  She would also like to discuss chronic intermittent achy joints with intermittent swelling. Aching to fingers and toes, wrists. She was tested for RA in 2017, testing was negative. Previously managed on Plaquinil. 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   Immunizations: -Tetanus: Completed in 2019 -Influenza: Completed this season    Diet: She endorses a healthy diet.  Exercise: She exercises daily.  Eye exam: Completed in 2020 Dental exam: Completes semi-annually   Pap Smear: Completed in 2020 Mammogram: Completed in August 2020 Colonoscopy: Never completed  BP Readings from Last 3 Encounters:  05/21/19 114/72  04/07/19 120/70  03/17/19 122/78   Wt Readings from Last 3 Encounters:  05/21/19 137 lb 8 oz (62.4 kg)  04/07/19 139 lb 4 oz (63.2 kg)  03/17/19 140 lb 4 oz (63.6 kg)      Review of Systems  Constitutional: Negative for unexpected weight change.  HENT: Negative for rhinorrhea.   Respiratory: Negative for cough and shortness of breath.   Cardiovascular: Negative for chest pain.  Gastrointestinal: Negative for constipation and diarrhea.  Genitourinary: Negative for difficulty urinating.  Musculoskeletal: Positive for arthralgias.  Skin: Negative for rash.  Allergic/Immunologic: Negative for environmental allergies.  Neurological: Negative for dizziness, numbness and headaches.  Psychiatric/Behavioral: Negative for suicidal ideas.       Doing well on Zoloft       Past Medical History:  Diagnosis Date  . Arthritis   . Cellulitis   . Chilblain lupus erythematosus   . Chilblains   . Depression   . GAD (generalized anxiety disorder)      Social History   Socioeconomic History  . Marital status: Single    Spouse name: Not on file   . Number of children: Not on file  . Years of education: Not on file  . Highest education level: Not on file  Occupational History  . Not on file  Social Needs  . Financial resource strain: Not on file  . Food insecurity    Worry: Not on file    Inability: Not on file  . Transportation needs    Medical: Not on file    Non-medical: Not on file  Tobacco Use  . Smoking status: Never Smoker  . Smokeless tobacco: Never Used  Substance and Sexual Activity  . Alcohol use: Yes    Alcohol/week: 0.0 standard drinks    Comment: occasional  . Drug use: No  . Sexual activity: Yes    Partners: Male    Birth control/protection: Pill  Lifestyle  . Physical activity    Days per week: Not on file    Minutes per session: Not on file  . Stress: Not on file  Relationships  . Social Herbalist on phone: Not on file    Gets together: Not on file    Attends religious service: Not on file    Active member of club or organization: Not on file    Attends meetings of clubs or organizations: Not on file    Relationship status: Not on file  . Intimate partner violence    Fear of current or ex partner: Not on file    Emotionally abused: Not on file    Physically abused: Not on  file    Forced sexual activity: Not on file  Other Topics Concern  . Not on file  Social History Narrative   Divorced   Employed as a Dance movement psychotherapist   No children.   Enjoys exercising, spending time with friends.    Past Surgical History:  Procedure Laterality Date  . AUGMENTATION MAMMAPLASTY    . BREAST ENHANCEMENT SURGERY  2002  . TONSILECTOMY, ADENOIDECTOMY, BILATERAL MYRINGOTOMY AND TUBES      Family History  Problem Relation Age of Onset  . Hearing loss Maternal Grandmother   . Cancer Maternal Grandmother        Liver cancer  . Thyroid nodules Mother   . Cancer Maternal Aunt 65       Breast cancer- died at 75  . Breast cancer Maternal Aunt   . Breast cancer Cousin     No Known  Allergies  Current Outpatient Medications on File Prior to Visit  Medication Sig Dispense Refill  . cetirizine (ZYRTEC) 10 MG tablet Take 1 tablet (10 mg total) by mouth daily. 90 tablet 0  . Cholecalciferol (D3 ADULT PO) Take 5,000 mg by mouth daily.    . COLLAGEN PO Take by mouth.    . fluticasone (FLONASE) 50 MCG/ACT nasal spray Place 2 sprays into both nostrils daily. 16 g 6  . Levonorgestrel-Ethinyl Estradiol (ASHLYNA) 0.15-0.03 &0.01 MG tablet Take 1 tablet by mouth daily. 1 Package 3  . Multiple Vitamins-Minerals (MULTIVITAMIN WITH MINERALS) tablet Take 1 tablet by mouth daily.      Marland Kitchen OVER THE COUNTER MEDICATION Celadrin    . sertraline (ZOLOFT) 50 MG tablet Take 1 tablet (50 mg total) by mouth daily. For anxiety. 90 tablet 3  . TURMERIC PO Take by mouth.     No current facility-administered medications on file prior to visit.     BP 114/72   Pulse 74   Temp (!) 97.4 F (36.3 C) (Temporal)   Ht 5' 5.5" (1.664 m)   Wt 137 lb 8 oz (62.4 kg)   SpO2 98%   BMI 22.53 kg/m    Objective:   Physical Exam  Constitutional: She is oriented to person, place, and time. She appears well-nourished.  HENT:  Right Ear: Tympanic membrane and ear canal normal.  Left Ear: Tympanic membrane and ear canal normal.  Mouth/Throat: Oropharynx is clear and moist.  Eyes: Pupils are equal, round, and reactive to light. EOM are normal.  Neck: Neck supple.  Cardiovascular: Normal rate and regular rhythm.  Respiratory: Effort normal and breath sounds normal.  GI: Soft. Bowel sounds are normal. There is no abdominal tenderness.  Musculoskeletal: Normal range of motion.  Neurological: She is alert and oriented to person, place, and time. No cranial nerve deficit.  Reflex Scores:      Patellar reflexes are 2+ on the right side and 2+ on the left side. Skin: Skin is warm and dry.  Psychiatric: She has a normal mood and affect.           Assessment & Plan:

## 2019-05-21 NOTE — Assessment & Plan Note (Signed)
Patient believes she's having a current flare.  Will repeat autoimmune labs.

## 2019-05-21 NOTE — Assessment & Plan Note (Signed)
Acute recently, moreso to extremities.  Repeat autoimmune labs pending.

## 2019-05-23 LAB — COMPREHENSIVE METABOLIC PANEL
AG Ratio: 1.6 (calc) (ref 1.0–2.5)
ALT: 26 U/L (ref 6–29)
AST: 33 U/L (ref 10–35)
Albumin: 4.1 g/dL (ref 3.6–5.1)
Alkaline phosphatase (APISO): 30 U/L — ABNORMAL LOW (ref 31–125)
BUN/Creatinine Ratio: 16 (calc) (ref 6–22)
BUN: 20 mg/dL (ref 7–25)
CO2: 27 mmol/L (ref 20–32)
Calcium: 9.3 mg/dL (ref 8.6–10.2)
Chloride: 105 mmol/L (ref 98–110)
Creat: 1.28 mg/dL — ABNORMAL HIGH (ref 0.50–1.10)
Globulin: 2.5 g/dL (calc) (ref 1.9–3.7)
Glucose, Bld: 79 mg/dL (ref 65–99)
Potassium: 4.6 mmol/L (ref 3.5–5.3)
Sodium: 138 mmol/L (ref 135–146)
Total Bilirubin: 0.4 mg/dL (ref 0.2–1.2)
Total Protein: 6.6 g/dL (ref 6.1–8.1)

## 2019-05-23 LAB — CBC
HCT: 41.2 % (ref 35.0–45.0)
Hemoglobin: 14.1 g/dL (ref 11.7–15.5)
MCH: 32.9 pg (ref 27.0–33.0)
MCHC: 34.2 g/dL (ref 32.0–36.0)
MCV: 96.3 fL (ref 80.0–100.0)
MPV: 8.9 fL (ref 7.5–12.5)
Platelets: 281 10*3/uL (ref 140–400)
RBC: 4.28 10*6/uL (ref 3.80–5.10)
RDW: 11 % (ref 11.0–15.0)
WBC: 5.3 10*3/uL (ref 3.8–10.8)

## 2019-05-23 LAB — LIPID PANEL
Cholesterol: 157 mg/dL (ref <200)
HDL: 73 mg/dL (ref >=50)
LDL Cholesterol (Calc): 65 mg/dL (calc)
Non-HDL Cholesterol (Calc): 84 mg/dL (calc) (ref <130)
Total CHOL/HDL Ratio: 2.2 (calc) (ref <5.0)
Triglycerides: 102 mg/dL (ref <150)

## 2019-05-23 LAB — LUTEINIZING HORMONE: LH: <0.2 m[IU]/mL — ABNORMAL LOW

## 2019-05-23 LAB — C-REACTIVE PROTEIN: CRP: 3.8 mg/L (ref <8.0)

## 2019-05-23 LAB — FOLLICLE STIMULATING HORMONE: FSH: 1.1 m[IU]/mL — ABNORMAL LOW

## 2019-05-23 LAB — TSH: TSH: 2.28 mIU/L

## 2019-05-23 LAB — RHEUMATOID FACTOR: Rhuematoid fact SerPl-aCnc: <14 IU/mL (ref <14)

## 2019-05-23 LAB — SEDIMENTATION RATE: Sed Rate: 2 mm/h (ref 0–20)

## 2019-05-23 LAB — CYCLIC CITRUL PEPTIDE ANTIBODY, IGG: Cyclic Citrullin Peptide Ab: <16 UNITS

## 2019-06-14 ENCOUNTER — Other Ambulatory Visit: Payer: Self-pay

## 2019-06-14 ENCOUNTER — Telehealth: Payer: Self-pay

## 2019-06-14 DIAGNOSIS — Z1211 Encounter for screening for malignant neoplasm of colon: Secondary | ICD-10-CM

## 2019-06-14 MED ORDER — NA SULFATE-K SULFATE-MG SULF 17.5-3.13-1.6 GM/177ML PO SOLN: 354.0000 mL | Freq: Once | ORAL | 0 refills | Status: AC

## 2019-06-14 NOTE — Telephone Encounter (Signed)
Gastroenterology Pre-Procedure Review    PATIENT REVIEW QUESTIONS: The patient responded to the following health history questions as indicated:    1. Are you having any GI issues? no 2. Do you have a personal history of Polyps? no 3. Do you have a family history of Colon Cancer or Polyps? no 4. Diabetes Mellitus? no 5. Joint replacements in the past 12 months?no 6. Major health problems in the past 3 months?no 7. Any artificial heart valves, MVP, or defibrillator?no    MEDICATIONS & ALLERGIES:    Patient reports the following regarding taking any anticoagulation/antiplatelet therapy:   Plavix, Coumadin, Eliquis, Xarelto, Lovenox, Pradaxa, Brilinta, or Effient? no Aspirin? no  Patient confirms/reports the following medications:  Current Outpatient Medications  Medication Sig Dispense Refill  . cetirizine (ZYRTEC) 10 MG tablet Take 1 tablet (10 mg total) by mouth daily. 90 tablet 0  . Cholecalciferol (D3 ADULT PO) Take 5,000 mg by mouth daily.    . COLLAGEN PO Take by mouth.    . fluticasone (FLONASE) 50 MCG/ACT nasal spray Place 2 sprays into both nostrils daily. 16 g 6  . Levonorgestrel-Ethinyl Estradiol (ASHLYNA) 0.15-0.03 &0.01 MG tablet Take 1 tablet by mouth daily. 1 Package 3  . Multiple Vitamins-Minerals (MULTIVITAMIN WITH MINERALS) tablet Take 1 tablet by mouth daily.      Marland Kitchen OVER THE COUNTER MEDICATION Celadrin    . sertraline (ZOLOFT) 50 MG tablet Take 1 tablet (50 mg total) by mouth daily. For anxiety. 90 tablet 3  . TURMERIC PO Take by mouth.     No current facility-administered medications for this visit.    Patient confirms/reports the following allergies:  No Known Allergies  No orders of the defined types were placed in this encounter.   AUTHORIZATION INFORMATION Primary Insurance: 1D#: Group #:  Secondary Insurance: 1D#: Group #:  SCHEDULE INFORMATION: Date: 07/19/2019 Time: Location:[facility]

## 2019-07-07 ENCOUNTER — Other Ambulatory Visit: Payer: Self-pay | Admitting: Primary Care

## 2019-07-07 DIAGNOSIS — J3489 Other specified disorders of nose and nasal sinuses: Secondary | ICD-10-CM

## 2019-07-07 MED ORDER — CETIRIZINE HCL 10 MG PO TABS: 10.0000 mg | ORAL_TABLET | Freq: Every day | ORAL | 1 refills | Status: DC

## 2019-07-13 ENCOUNTER — Telehealth: Payer: Self-pay | Admitting: Gastroenterology

## 2019-07-13 NOTE — Telephone Encounter (Signed)
pATIENT CALLED & L/M ON V/M STATING SHE WANTED TO CANCEL COLONOSCOPY SCHEDULED FOR 07-19-19. sHE IS UNSURE IF THE INSURANCE WILL COVER & BECAUSE OF COVID.

## 2019-07-13 NOTE — Telephone Encounter (Signed)
Called and left a message for call back  

## 2019-07-13 NOTE — Telephone Encounter (Addendum)
Called and left a message for call back to get more information from patient because in the referral Traci has got approval for the procedure so we can still do the procedure

## 2019-07-14 NOTE — Telephone Encounter (Signed)
Called Mary Mason and canceled procedure

## 2019-07-14 NOTE — Telephone Encounter (Signed)
Called patient and patient states she does not want to go in the hospital because of covid.

## 2019-07-19 ENCOUNTER — Ambulatory Visit: Admit: 2019-07-19 | Payer: PRIVATE HEALTH INSURANCE | Admitting: Gastroenterology

## 2019-07-19 SURGERY — COLONOSCOPY WITH PROPOFOL
Anesthesia: General

## 2019-07-22 ENCOUNTER — Other Ambulatory Visit: Payer: Self-pay | Admitting: Primary Care

## 2019-07-22 DIAGNOSIS — F411 Generalized anxiety disorder: Secondary | ICD-10-CM

## 2019-07-22 MED ORDER — SERTRALINE HCL 50 MG PO TABS: 50.0000 mg | ORAL_TABLET | Freq: Every day | ORAL | 1 refills | Status: DC

## 2019-10-05 ENCOUNTER — Other Ambulatory Visit: Payer: Self-pay

## 2019-10-05 ENCOUNTER — Ambulatory Visit (INDEPENDENT_AMBULATORY_CARE_PROVIDER_SITE_OTHER): Payer: Self-pay | Admitting: Dermatology

## 2019-10-05 ENCOUNTER — Encounter: Payer: Self-pay | Admitting: Dermatology

## 2019-10-05 DIAGNOSIS — L988 Other specified disorders of the skin and subcutaneous tissue: Secondary | ICD-10-CM

## 2019-10-05 NOTE — Progress Notes (Signed)
   Follow-Up Visit   Subjective  Mary Mason is a 49 y.o. female who presents for the following: Procedure (Botox today).   The following portions of the chart were reviewed this encounter and updated as appropriate:     Review of Systems: No other skin or systemic complaints.  Objective  Well appearing patient in no apparent distress; mood and affect are within normal limits.  A focused examination was performed including face. Relevant physical exam findings are noted in the Assessment and Plan.  Objective  Frown complex, Forehead, DAO, Upper lip: Facial elastosis  Images            Assessment & Plan  Elastosis of skin Frown complex, Forehead, DAO, Upper lip  Location: See attached image  Informed consent: Discussed risks (infection, pain, bleeding, bruising, swelling, allergic reaction, paralysis of nearby muscles, eyelid droop, double vision, neck weakness, difficulty breathing, headache, undesirable cosmetic result, and need for additional treatment) and benefits of the procedure, as well as the alternatives.  Informed consent was obtained.  Preparation: The area was cleansed with alcohol.  Procedure Details:  Botox was injected into the dermis with a 30-gauge needle. Pressure applied to any bleeding. Ice packs offered for swelling.  Lot Number:  [id number] Expiration:  05/2022  Total Units Injected:  37  Plan: Patient was instructed to remain upright for 4 hours. Patient was instructed to avoid massaging the face and avoid vigorous exercise for the rest of the day. Tylenol may be used for headache.  Allow 2 weeks before returning to clinic for additional dosing as needed. Patient will call for any problems.   Return in about 2 months (around 12/05/2019) for Filler, possibly Botox.   Lindi Adie, CMA, am acting as scribe for Sarina Ser, MD .

## 2019-10-25 ENCOUNTER — Ambulatory Visit (INDEPENDENT_AMBULATORY_CARE_PROVIDER_SITE_OTHER): Payer: Self-pay | Admitting: Dermatology

## 2019-10-25 ENCOUNTER — Other Ambulatory Visit: Payer: Self-pay

## 2019-10-25 DIAGNOSIS — L988 Other specified disorders of the skin and subcutaneous tissue: Secondary | ICD-10-CM

## 2019-10-25 NOTE — Progress Notes (Signed)
   Follow-Up Visit   Subjective  Mary Mason is a 49 y.o. female who presents for the following: Facial Elastosis (2 weeks f/u on Botox ).   The following portions of the chart were reviewed this encounter and updated as appropriate:      Review of Systems:  No other skin or systemic complaints except as noted in HPI or Assessment and Plan.  Objective  Well appearing patient in no apparent distress; mood and affect are within normal limits.  A focused examination was performed including face. Relevant physical exam findings are noted in the Assessment and Plan.  Objective  Left Botox comma: Rhytides and volume loss.   Images       Assessment & Plan  Elastosis of skin Left Botox comma  Botox Injection - Left Botox comma Location: See attached image  Informed consent: Discussed risks (infection, pain, bleeding, bruising, swelling, allergic reaction, paralysis of nearby muscles, eyelid droop, double vision, neck weakness, difficulty breathing, headache, undesirable cosmetic result, and need for additional treatment) and benefits of the procedure, as well as the alternatives.  Informed consent was obtained.  Preparation: The area was cleansed with alcohol.  Procedure Details:  Botox was injected into the dermis with a 30-gauge needle. Pressure applied to any bleeding. Ice packs offered for swelling.  Lot Number:  [id number] Expiration:  07/23  Total Units Injected:  1.25  Plan: Patient was instructed to remain upright for 4 hours. Patient was instructed to avoid massaging the face and avoid vigorous exercise for the rest of the day. Tylenol may be used for headache.  Allow 2 weeks before returning to clinic for additional dosing as needed. Patient will call for any problems.   Return if symptoms worsen or fail to improve.  IMarye Round, CMA, am acting as scribe for Sarina Ser, MD .  Documentation: I have reviewed the above documentation for accuracy and  completeness, and I agree with the above.  Sarina Ser, MD

## 2019-11-25 ENCOUNTER — Other Ambulatory Visit: Payer: Self-pay

## 2019-11-25 ENCOUNTER — Ambulatory Visit (INDEPENDENT_AMBULATORY_CARE_PROVIDER_SITE_OTHER): Payer: Self-pay | Admitting: Dermatology

## 2019-11-25 DIAGNOSIS — L988 Other specified disorders of the skin and subcutaneous tissue: Secondary | ICD-10-CM

## 2019-11-25 NOTE — Progress Notes (Signed)
   Follow-Up Visit   Subjective  Mary Mason is a 50 y.o. female who presents for the following: Facial Elastosis (patient is here today for fillers - she has a destination wedding to attend in 2 weeks).  The following portions of the chart were reviewed this encounter and updated as appropriate:  Tobacco  Allergies  Meds  Problems  Med Hx  Surg Hx  Fam Hx     Review of Systems:  No other skin or systemic complaints except as noted in HPI or Assessment and Plan.  Objective  Well appearing patient in no apparent distress; mood and affect are within normal limits.  A focused examination was performed including the face. Relevant physical exam findings are noted in the Assessment and Plan.  Objective  Face: Rhytides and volume loss.    Assessment & Plan  Elastosis of skin Face  Restylane Refyne 1.0 cc injected into: - The B/L oral commisures  - The B/L nasolabial creases - The upper and lower lip vermillion - The anterior chin crease  Lot [id number] Exp date: 08/28/2020  Return if symptoms worsen or fail to improve.  Luther Redo, CMA, am acting as scribe for Sarina Ser, MD .  Documentation: I have reviewed the above documentation for accuracy and completeness, and I agree with the above.  Sarina Ser, MD

## 2019-11-30 ENCOUNTER — Other Ambulatory Visit: Payer: Self-pay | Admitting: Family Medicine

## 2019-12-04 ENCOUNTER — Encounter: Payer: Self-pay | Admitting: Dermatology

## 2019-12-29 ENCOUNTER — Other Ambulatory Visit: Payer: Self-pay | Admitting: Primary Care

## 2019-12-29 DIAGNOSIS — Z124 Encounter for screening for malignant neoplasm of cervix: Secondary | ICD-10-CM

## 2020-01-16 ENCOUNTER — Other Ambulatory Visit: Payer: Self-pay | Admitting: Primary Care

## 2020-01-16 DIAGNOSIS — F411 Generalized anxiety disorder: Secondary | ICD-10-CM

## 2020-01-18 ENCOUNTER — Other Ambulatory Visit: Payer: Self-pay

## 2020-01-18 ENCOUNTER — Other Ambulatory Visit: Payer: Self-pay | Admitting: Primary Care

## 2020-01-18 ENCOUNTER — Ambulatory Visit (INDEPENDENT_AMBULATORY_CARE_PROVIDER_SITE_OTHER): Payer: Self-pay | Admitting: Dermatology

## 2020-01-18 DIAGNOSIS — L988 Other specified disorders of the skin and subcutaneous tissue: Secondary | ICD-10-CM

## 2020-01-18 DIAGNOSIS — J3489 Other specified disorders of nose and nasal sinuses: Secondary | ICD-10-CM

## 2020-01-18 NOTE — Progress Notes (Signed)
   Follow-Up Visit   Subjective  Mary Mason is a 49 y.o. female who presents for the following: Facial Elastosis (patient is here today for Botox ).  The following portions of the chart were reviewed this encounter and updated as appropriate:  Tobacco  Allergies  Meds  Problems  Med Hx  Surg Hx  Fam Hx     Review of Systems:  No other skin or systemic complaints except as noted in HPI or Assessment and Plan.  Objective  Well appearing patient in no apparent distress; mood and affect are within normal limits.  A focused examination was performed including the face. Relevant physical exam findings are noted in the Assessment and Plan.  Objective  Face: Rhytides and volume loss.   Images    Assessment & Plan    Elastosis of skin Face  Botox Injection - Face Location: See attached image  Informed consent: Discussed risks (infection, pain, bleeding, bruising, swelling, allergic reaction, paralysis of nearby muscles, eyelid droop, double vision, neck weakness, difficulty breathing, headache, undesirable cosmetic result, and need for additional treatment) and benefits of the procedure, as well as the alternatives.  Informed consent was obtained.  Preparation: The area was cleansed with alcohol.  Procedure Details:  Botox was injected into the dermis with a 30-gauge needle. Pressure applied to any bleeding. Ice packs offered for swelling.  Lot Number: [id number] Expiration:  05/2022  Total Units Injected:  38.25  Plan: Patient was instructed to remain upright for 4 hours. Patient was instructed to avoid massaging the face and avoid vigorous exercise for the rest of the day. Tylenol may be used for headache.  Allow 2 weeks before returning to clinic for additional dosing as needed. Patient will call for any problems.   Return in about 3 months (around 04/19/2020) for Botox.   Documentation: I have reviewed the above documentation for accuracy and completeness, and I  agree with the above.  Sarina Ser, MD  I, Rudell Cobb, CMA, am acting as scribe for Sarina Ser, MD .  Documentation: I have reviewed the above documentation for accuracy and completeness, and I agree with the above.  Sarina Ser, MD

## 2020-01-20 ENCOUNTER — Encounter: Payer: Self-pay | Admitting: Dermatology

## 2020-03-28 ENCOUNTER — Other Ambulatory Visit: Payer: Self-pay | Admitting: Primary Care

## 2020-03-28 DIAGNOSIS — Z124 Encounter for screening for malignant neoplasm of cervix: Secondary | ICD-10-CM

## 2020-05-15 ENCOUNTER — Other Ambulatory Visit: Payer: Self-pay

## 2020-05-15 ENCOUNTER — Other Ambulatory Visit: Payer: Self-pay | Admitting: Primary Care

## 2020-05-15 ENCOUNTER — Other Ambulatory Visit (INDEPENDENT_AMBULATORY_CARE_PROVIDER_SITE_OTHER): Payer: 59

## 2020-05-15 DIAGNOSIS — Z0000 Encounter for general adult medical examination without abnormal findings: Secondary | ICD-10-CM

## 2020-05-15 DIAGNOSIS — N951 Menopausal and female climacteric states: Secondary | ICD-10-CM

## 2020-05-15 LAB — COMPREHENSIVE METABOLIC PANEL
ALT: 24 U/L (ref 0–35)
AST: 31 U/L (ref 0–37)
Albumin: 3.9 g/dL (ref 3.5–5.2)
Alkaline Phosphatase: 32 U/L — ABNORMAL LOW (ref 39–117)
BUN: 16 mg/dL (ref 6–23)
CO2: 28 mEq/L (ref 19–32)
Calcium: 9 mg/dL (ref 8.4–10.5)
Chloride: 100 mEq/L (ref 96–112)
Creatinine, Ser: 0.86 mg/dL (ref 0.40–1.20)
GFR: 79.4 mL/min (ref >60.00)
Glucose, Bld: 82 mg/dL (ref 70–99)
Potassium: 4.1 mEq/L (ref 3.5–5.1)
Sodium: 135 mEq/L (ref 135–145)
Total Bilirubin: 0.5 mg/dL (ref 0.2–1.2)
Total Protein: 6.4 g/dL (ref 6.0–8.3)

## 2020-05-15 LAB — CBC
HCT: 37.2 % (ref 36.0–46.0)
Hemoglobin: 12.4 g/dL (ref 12.0–15.0)
MCHC: 33.2 g/dL (ref 30.0–36.0)
MCV: 87.3 fl (ref 78.0–100.0)
Platelets: 283 10*3/uL (ref 150.0–400.0)
RBC: 4.27 Mil/uL (ref 3.87–5.11)
RDW: 12.6 % (ref 11.5–15.5)
WBC: 6 10*3/uL (ref 4.0–10.5)

## 2020-05-15 LAB — LIPID PANEL
Cholesterol: 145 mg/dL (ref 0–200)
HDL: 69.2 mg/dL (ref >39.00)
LDL Cholesterol: 63 mg/dL (ref 0–99)
NonHDL: 76.07
Total CHOL/HDL Ratio: 2
Triglycerides: 67 mg/dL (ref 0.0–149.0)
VLDL: 13.4 mg/dL (ref 0.0–40.0)

## 2020-05-15 LAB — LUTEINIZING HORMONE: LH: 0 m[IU]/mL

## 2020-05-15 LAB — FOLLICLE STIMULATING HORMONE: FSH: 0.1 m[IU]/mL

## 2020-05-15 LAB — TSH: TSH: 1.86 u[IU]/mL (ref 0.35–4.50)

## 2020-05-15 NOTE — Addendum Note (Signed)
Addended by: Cloyd Stagers on: 05/15/2020 01:58 PM   Modules accepted: Orders

## 2020-05-16 ENCOUNTER — Other Ambulatory Visit: Payer: Self-pay

## 2020-05-16 ENCOUNTER — Ambulatory Visit (INDEPENDENT_AMBULATORY_CARE_PROVIDER_SITE_OTHER): Payer: Self-pay | Admitting: Dermatology

## 2020-05-16 DIAGNOSIS — L988 Other specified disorders of the skin and subcutaneous tissue: Secondary | ICD-10-CM

## 2020-05-16 NOTE — Progress Notes (Signed)
   Follow-Up Visit   Subjective  Mary Mason is a 49 y.o. female who presents for the following: Facial Elastosis (face, pt presents for botox, last txt 01/18/20).  The following portions of the chart were reviewed this encounter and updated as appropriate:  Tobacco  Allergies  Meds  Problems  Med Hx  Surg Hx  Fam Hx     Review of Systems:  No other skin or systemic complaints except as noted in HPI or Assessment and Plan.  Objective  Well appearing patient in no apparent distress; mood and affect are within normal limits.  A focused examination was performed including face. Relevant physical exam findings are noted in the Assessment and Plan.  Objective  face: Rhytides and volume loss.   Images     Assessment & Plan  Elastosis of skin face  Botox today, 38.25 units injected as marked: - Frown complex 20 units - Forehead 5 units - Botox comma Left side 1.25 units - DAO 4 units each side total of 8 units - Upper lip 4 untis   Discussed adding more filler to nasolabial area and chin   Intralesional injection - face Location: Frown complex, forehead, botox comma on Left, DAO's, upper lip  Informed consent: Discussed risks (infection, pain, bleeding, bruising, swelling, allergic reaction, paralysis of nearby muscles, eyelid droop, double vision, neck weakness, difficulty breathing, headache, undesirable cosmetic result, and need for additional treatment) and benefits of the procedure, as well as the alternatives.  Informed consent was obtained.  Preparation: The area was cleansed with alcohol.  Procedure Details:  Botox was injected into the dermis with a 30-gauge needle. Pressure applied to any bleeding. Ice packs offered for swelling.  Lot Number:  [id number] Expiration:  01/24  Total Units Injected:  38.25  Plan: Patient was instructed to remain upright for 4 hours. Patient was instructed to avoid massaging the face and avoid vigorous exercise for the rest of  the day. Tylenol may be used for headache.  Allow 2 weeks before returning to clinic for additional dosing as needed. Patient will call for any problems.   Return in about 3 months (around 08/16/2020) for 3-77m Botox.  I, Othelia Pulling, RMA, am acting as scribe for Sarina Ser, MD .  Documentation: I have reviewed the above documentation for accuracy and completeness, and I agree with the above.  Sarina Ser, MD

## 2020-05-18 ENCOUNTER — Other Ambulatory Visit: Payer: Self-pay | Admitting: Primary Care

## 2020-05-19 ENCOUNTER — Encounter: Payer: Self-pay | Admitting: Dermatology

## 2020-05-22 ENCOUNTER — Ambulatory Visit (INDEPENDENT_AMBULATORY_CARE_PROVIDER_SITE_OTHER): Payer: 59 | Admitting: Primary Care

## 2020-05-22 ENCOUNTER — Other Ambulatory Visit: Payer: Self-pay

## 2020-05-22 ENCOUNTER — Encounter: Payer: Self-pay | Admitting: Primary Care

## 2020-05-22 VITALS — BP 118/62 | HR 80 | Temp 97.0°F | Ht 65.0 in | Wt 140.0 lb

## 2020-05-22 DIAGNOSIS — J3089 Other allergic rhinitis: Secondary | ICD-10-CM

## 2020-05-22 DIAGNOSIS — Z0000 Encounter for general adult medical examination without abnormal findings: Secondary | ICD-10-CM

## 2020-05-22 DIAGNOSIS — F411 Generalized anxiety disorder: Secondary | ICD-10-CM

## 2020-05-22 DIAGNOSIS — Z1231 Encounter for screening mammogram for malignant neoplasm of breast: Secondary | ICD-10-CM | POA: Diagnosis not present

## 2020-05-22 DIAGNOSIS — Z23 Encounter for immunization: Secondary | ICD-10-CM | POA: Diagnosis not present

## 2020-05-22 DIAGNOSIS — Z124 Encounter for screening for malignant neoplasm of cervix: Secondary | ICD-10-CM

## 2020-05-22 DIAGNOSIS — R5383 Other fatigue: Secondary | ICD-10-CM

## 2020-05-22 MED ORDER — LEVONORGEST-ETH ESTRAD 91-DAY 0.15-0.03 &0.01 MG PO TABS: 1.0000 | ORAL_TABLET | Freq: Every day | ORAL | 3 refills | Status: DC

## 2020-05-22 MED ORDER — CETIRIZINE HCL 10 MG PO TABS: 10.0000 mg | ORAL_TABLET | Freq: Every day | ORAL | 3 refills | Status: DC

## 2020-05-22 MED ORDER — SERTRALINE HCL 50 MG PO TABS: ORAL_TABLET | ORAL | 3 refills | Status: DC

## 2020-05-22 MED ORDER — FLUTICASONE PROPIONATE 50 MCG/ACT NA SUSP: NASAL | 5 refills | Status: DC

## 2020-05-22 NOTE — Assessment & Plan Note (Signed)
Doing well on daily Zyrtec and Flonase.  Refills provided.  Continue same.

## 2020-05-22 NOTE — Assessment & Plan Note (Signed)
Doing well on sertraline 50 mg daily, continue same.  

## 2020-05-22 NOTE — Patient Instructions (Addendum)
Call the [facility] to schedule your mammogram.   Stop by the lab prior to leaving today. I will notify you of your results once received.   Continue to eat a healthy diet and exercise regularly.   Ensure you are consuming 64 ounces of water daily.  It was a pleasure to see you today!   Preventive Care 30-48 Years Old, Female Preventive care refers to visits with your health care provider and lifestyle choices that can promote health and wellness. This includes:  A yearly physical exam. This may also be called an annual well check.  Regular dental visits and eye exams.  Immunizations.  Screening for certain conditions.  Healthy lifestyle choices, such as eating a healthy diet, getting regular exercise, not using drugs or products that contain nicotine and tobacco, and limiting alcohol use. What can I expect for my preventive care visit? Physical exam Your health care provider will check your:  Height and weight. This may be used to calculate body mass index (BMI), which tells if you are at a healthy weight.  Heart rate and blood pressure.  Skin for abnormal spots. Counseling Your health care provider may ask you questions about your:  Alcohol, tobacco, and drug use.  Emotional well-being.  Home and relationship well-being.  Sexual activity.  Eating habits.  Work and work Statistician.  Method of birth control.  Menstrual cycle.  Pregnancy history. What immunizations do I need?  Influenza (flu) vaccine  This is recommended every year. Tetanus, diphtheria, and pertussis (Tdap) vaccine  You may need a Td booster every 10 years. Varicella (chickenpox) vaccine  You may need this if you have not been vaccinated. Zoster (shingles) vaccine  You may need this after age 33. Measles, mumps, and rubella (MMR) vaccine  You may need at least one dose of MMR if you were born in 1957 or later. You may also need a second dose. Pneumococcal conjugate  (PCV13) vaccine  You may need this if you have certain conditions and were not previously vaccinated. Pneumococcal polysaccharide (PPSV23) vaccine  You may need one or two doses if you smoke cigarettes or if you have certain conditions. Meningococcal conjugate (MenACWY) vaccine  You may need this if you have certain conditions. Hepatitis A vaccine  You may need this if you have certain conditions or if you travel or work in places where you may be exposed to hepatitis A. Hepatitis B vaccine  You may need this if you have certain conditions or if you travel or work in places where you may be exposed to hepatitis B. Haemophilus influenzae type b (Hib) vaccine  You may need this if you have certain conditions. Human papillomavirus (HPV) vaccine  If recommended by your health care provider, you may need three doses over 6 months. You may receive vaccines as individual doses or as more than one vaccine together in one shot (combination vaccines). Talk with your health care provider about the risks and benefits of combination vaccines. What tests do I need? Blood tests  Lipid and cholesterol levels. These may be checked every 5 years, or more frequently if you are over 91 years old.  Hepatitis C test.  Hepatitis B test. Screening  Lung cancer screening. You may have this screening every year starting at age 29 if you have a 30-pack-year history of smoking and currently smoke or have quit within the past 15 years.  Colorectal cancer screening. All adults should have this screening starting at age 85 and continuing  until age 23. Your health care provider may recommend screening at age 86 if you are at increased risk. You will have tests every 1-10 years, depending on your results and the type of screening test.  Diabetes screening. This is done by checking your blood sugar (glucose) after you have not eaten for a while (fasting). You may have this done every 1-3 years.  Mammogram. This  may be done every 1-2 years. Talk with your health care provider about when you should start having regular mammograms. This may depend on whether you have a family history of breast cancer.  BRCA-related cancer screening. This may be done if you have a family history of breast, ovarian, tubal, or peritoneal cancers.  Pelvic exam and Pap test. This may be done every 3 years starting at age 34. Starting at age 54, this may be done every 5 years if you have a Pap test in combination with an HPV test. Other tests  Sexually transmitted disease (STD) testing.  Bone density scan. This is done to screen for osteoporosis. You may have this scan if you are at high risk for osteoporosis. Follow these instructions at home: Eating and drinking  Eat a diet that includes fresh fruits and vegetables, whole grains, lean protein, and low-fat dairy.  Take vitamin and mineral supplements as recommended by your health care provider.  Do not drink alcohol if: ? Your health care provider tells you not to drink. ? You are pregnant, may be pregnant, or are planning to become pregnant.  If you drink alcohol: ? Limit how much you have to 0-1 drink a day. ? Be aware of how much alcohol is in your drink. In the [place] one drink equals one 12 oz bottle of beer (355 mL), one 5 oz glass of wine (148 mL), or one 1 oz glass of hard liquor (44 mL). Lifestyle  Take daily care of your teeth and gums.  Stay active. Exercise for at least 30 minutes on 5 or more days each week.  Do not use any products that contain nicotine or tobacco, such as cigarettes, e-cigarettes, and chewing tobacco. If you need help quitting, ask your health care provider.  If you are sexually active, practice safe sex. Use a condom or other form of birth control (contraception) in order to prevent pregnancy and STIs (sexually transmitted infections).  If told by your health care provider, take low-dose aspirin daily starting at age 22. What's  next?  Visit your health care provider once a year for a well check visit.  Ask your health care provider how often you should have your eyes and teeth checked.  Stay up to date on all vaccines. This information is not intended to replace advice given to you by your health care provider. Make sure you discuss any questions you have with your health care provider. Document Revised: 02/26/2018 Document Reviewed: 02/26/2018 Elsevier Patient Education  [place] Maintenance Due  Topic Date Due  . Hepatitis C Screening will order with next labs  Never done  . COVID-19 Vaccine (1) updated in chart  Never done  . INFLUENZA VACCINE will get today  01/30/2020      Recommended follow up: No follow-ups on file.   You have an appointment scheduled for: $RemoveBefore'[]'ZZMDXFYUJEMrX$   2D Mammogram  $RemoveBe'[x]'qnztuvGhp$   3D Mammogram  $RemoveBe'[]'uUjWSKPCl$   Bone Density   Call for appointment    Your appointment will at the following location  $RemoveB'[]'XXYayRBu$   [facility]  [place] [address]  [phone]  $RemoveBefor'[]'CeLSDNgYSPrA$   [place] [facility])   [address] [place], [place] [address]  [phone]  $Rem'[]'EBSY$   [facility]      [address] [place], [place]         '[]'$   [facility]  [place], [place]  $Remove'[]'eLZbXhL$  [facility]   [address] [place], [place] [address]  $Remo'[]'UfRQL$  [place]  [place] [address] [place],  [address] [phone]    Make sure to wear two peace clothing  No lotions powders or deodorants the day of the appointment Make sure to bring picture ID and insurance card.  Bring list of medications you are currently taking including any supplements.   Influenza (Flu) Vaccine (Inactivated or Recombinant): What You Need to  Know 1. Why get vaccinated? Influenza vaccine can prevent influenza (flu). Flu is a contagious disease that spreads around the [place] every year, usually between October and May. Anyone can get the flu, but it is more dangerous for some people. Infants and young children, people 92 years of age and older, pregnant women, and people with certain health conditions or a weakened immune system are at greatest risk of flu complications. Pneumonia, bronchitis, sinus infections and ear infections are examples of flu-related complications. If you have a medical condition, such as heart disease, cancer or diabetes, flu can make it worse. Flu can cause fever and chills, sore throat, muscle aches, fatigue, cough, headache, and runny or stuffy nose. Some people may have vomiting and diarrhea, though this is more common in children than adults. Each year thousands of people in the [place] die from flu, and many more are hospitalized. Flu vaccine prevents millions of illnesses and flu-related visits to the doctor each year. 2. Influenza vaccine CDC recommends everyone 23 months of age and older get vaccinated every flu season. Children 6 months through 51 years of age may need 2 doses during a single flu season. Everyone else needs only 1 dose each flu season. It takes about 2 weeks for protection to develop after vaccination. There are many flu viruses, and they are always changing. Each year a new flu vaccine is made to protect against three or four viruses that are likely to cause disease in the upcoming flu season. Even when the vaccine doesn't exactly match these viruses, it may still provide some protection. Influenza vaccine does not cause flu. Influenza vaccine may be given at the same time as other vaccines. 3. Talk with your health care provider Tell your vaccine provider if the person getting the vaccine:  Has had an allergic reaction after a previous dose of influenza vaccine, or has any  severe, life-threatening allergies.  Has ever had Guillain-Barr Syndrome (also called GBS). In some cases, your health care provider may decide to postpone influenza vaccination to a future visit. People with minor illnesses, such as a cold, may be vaccinated. People who are moderately or severely ill should usually wait until they recover before getting influenza vaccine. Your health care provider can give you more information. 4. Risks of a vaccine reaction  Soreness,  redness, and swelling where shot is given, fever, muscle aches, and headache can happen after influenza vaccine.  There may be a very small increased risk of Guillain-Barr Syndrome (GBS) after inactivated influenza vaccine (the flu shot). Young children who get the flu shot along with pneumococcal vaccine (PCV13), and/or DTaP vaccine at the same time might be slightly more likely to have a seizure caused by fever. Tell your health care provider if a child who is getting flu vaccine has ever had a seizure. People sometimes faint after medical procedures, including vaccination. Tell your provider if you feel dizzy or have vision changes or ringing in the ears. As with any medicine, there is a very remote chance of a vaccine causing a severe allergic reaction, other serious injury, or death. 5. What if there is a serious problem? An allergic reaction could occur after the vaccinated person leaves the clinic. If you see signs of a severe allergic reaction (hives, swelling of the face and throat, difficulty breathing, a fast heartbeat, dizziness, or weakness), call 9-1-1 and get the person to the nearest hospital. For other signs that concern you, call your health care provider. Adverse reactions should be reported to the Vaccine Adverse Event Reporting System (VAERS). Your health care provider will usually file this report, or you can do it yourself. Visit the VAERS website at www.vaers.SamedayNews.es or call (281)578-7441.VAERS is only for  reporting reactions, and VAERS staff do not give medical advice. 6. The National Vaccine Injury Compensation Program The [employer] Vaccine Injury Compensation Program (VICP) is a federal program that was created to compensate people who may have been injured by certain vaccines. Visit the VICP website at GoldCloset.com.ee or call 563-625-4961 to learn about the program and about filing a claim. There is a time limit to file a claim for compensation. 7. How can I learn more?  Ask your healthcare provider.  Call your local or state health department.  Contact the Centers for Disease Control and Prevention (CDC): ? Call 319-733-9952 (1-800-CDC-INFO) or ? Visit CDC's https://gibson.com/ Vaccine Information Statement (Interim) Inactivated Influenza Vaccine (02/12/2018) This information is not intended to replace advice given to you by your health care provider. Make sure you discuss any questions you have with your health care provider. Document Revised: 10/06/2018 Document Reviewed: 02/16/2018 Elsevier Patient Education  [place].

## 2020-05-22 NOTE — Assessment & Plan Note (Signed)
Immunizations up-to-date, influenza vaccine provided today. Pap smear up-to-date, due in 2023. Colonoscopy due next year. Mammogram due, orders placed.  Commended her on a healthy diet and regular exercise. Exam today benign. Labs reviewed and also pending.

## 2020-05-22 NOTE — Progress Notes (Signed)
Subjective:    Patient ID: Mary Mason, female    DOB: 06/04/1971, 49 y.o.   MRN: [id number]  HPI  This visit occurred during the SARS-CoV-2 public health emergency.  Safety protocols were in place, including screening questions prior to the visit, additional usage of staff PPE, and extensive cleaning of exam room while observing appropriate contact time as indicated for disinfecting solutions.   Mary Mason is a very pleasant 49 year old female who presents today for complete physical.  She has noticed ongoing fatigue, daytime tiredness, feels that if she's sitting still she could fall asleep. She will sometimes take naps during her lunch break, has to set her alarm or she will sleep for hours.  She typically sleeps 6 hours nightly, denies snoring.  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  She does notice less fatigue on the weekends when she is able to rest longer.  She is managed on oral contraceptives, takes them continuously as to avoid menses.  Her mother went through menopause around her early 44s.  Immunizations: -Tetanus: Completed in 2019 -Influenza: Due today -Covid-19: Completed series   Diet: She endorses a healthy diet.  Exercise: She is exercising regularly.   Eye exam: Due, she will schedule  Dental exam: Completes semi-annually   Pap Smear: Completed in 2020 Mammogram: Completed in August 2020 Colonoscopy: Completed in 2012  BP Readings from Last 3 Encounters:  05/22/20 118/62  05/21/19 114/72  04/07/19 120/70    Wt Readings from Last 3 Encounters:  05/22/20 140 lb (63.5 kg)  05/21/19 137 lb 8 oz (62.4 kg)  04/07/19 139 lb 4 oz (63.2 kg)     Review of Systems  Constitutional: Negative for unexpected weight change.       Daytime tiredness/fatigue  HENT: Negative for rhinorrhea.   Eyes: Negative for visual disturbance.  Respiratory: Negative for cough and shortness of breath.     Cardiovascular: Negative for chest pain.  Gastrointestinal: Negative for constipation and diarrhea.  Genitourinary: Negative for difficulty urinating and menstrual problem.  Musculoskeletal: Negative for arthralgias and myalgias.  Skin: Negative for rash.  Allergic/Immunologic: Positive for environmental allergies.  Neurological: Negative for dizziness, numbness and headaches.  Psychiatric/Behavioral: The patient is not nervous/anxious.        Doing well on Zoloft.       Past Medical History:  Diagnosis Date  . Arthritis   . Cellulitis   . Chilblain lupus erythematosus   . Chilblains   . Depression   . GAD (generalized anxiety disorder)      Social History   Socioeconomic History  . Marital status: Single    Spouse name: Not on file  . Number of children: Not on file  . Years of education: Not on file  . Highest education level: Not on file  Occupational History  . Not on file  Tobacco Use  . Smoking status: Never Smoker  . Smokeless tobacco: Never Used  Substance and Sexual Activity  . Alcohol use: Yes    Alcohol/week: 0.0 standard drinks    Comment: occasional  . Drug use: No  . Sexual activity: Yes    Partners: Male    Birth control/protection: Pill  Other Topics Concern  . Not on file  Social History Narrative   Divorced   Employed as a Dance movement psychotherapist   No children.   Enjoys exercising, spending time with friends.  Social Determinants of Health   Financial Resource Strain:   . Difficulty of Paying Living Expenses: Not on file  Food Insecurity:   . Worried About Charity fundraiser in the Last Year: Not on file  . Ran Out of Food in the Last Year: Not on file  Transportation Needs:   . Lack of Transportation (Medical): Not on file  . Lack of Transportation (Non-Medical): Not on file  Physical Activity:   . Days of Exercise per Week: Not on file  . Minutes of Exercise per Session: Not on file  Stress:   . Feeling of Stress : Not on file   Social Connections:   . Frequency of Communication with Friends and Family: Not on file  . Frequency of Social Gatherings with Friends and Family: Not on file  . Attends Religious Services: Not on file  . Active Member of Clubs or Organizations: Not on file  . Attends Archivist Meetings: Not on file  . Marital Status: Not on file  Intimate Partner Violence:   . Fear of Current or Ex-Partner: Not on file  . Emotionally Abused: Not on file  . Physically Abused: Not on file  . Sexually Abused: Not on file    Past Surgical History:  Procedure Laterality Date  . AUGMENTATION MAMMAPLASTY    . BREAST ENHANCEMENT SURGERY  2002  . TONSILECTOMY, ADENOIDECTOMY, BILATERAL MYRINGOTOMY AND TUBES      Family History  Problem Relation Age of Onset  . Hearing loss Maternal Grandmother   . Cancer Maternal Grandmother        Liver cancer  . Thyroid nodules Mother   . Cancer Maternal Aunt 66       Breast cancer- died at 67  . Breast cancer Maternal Aunt   . Breast cancer Cousin     No Known Allergies  Current Outpatient Medications on File Prior to Visit  Medication Sig Dispense Refill  . Cholecalciferol (D3 ADULT PO) Take 5,000 mg by mouth daily.    . COLLAGEN PO Take by mouth.    . Multiple Vitamins-Minerals (MULTIVITAMIN WITH MINERALS) tablet Take 1 tablet by mouth daily.      Marland Kitchen OVER THE COUNTER MEDICATION Celadrin    . TURMERIC PO Take by mouth.     No current facility-administered medications on file prior to visit.    BP 118/62   Pulse 80   Temp (!) 97 F (36.1 C) (Temporal)   Ht 5\' 5"  (1.651 m)   Wt 140 lb (63.5 kg)   SpO2 97%   BMI 23.30 kg/m    Objective:   Physical Exam HENT:     Right Ear: Tympanic membrane and ear canal normal.     Left Ear: Tympanic membrane and ear canal normal.  Eyes:     Pupils: Pupils are equal, round, and reactive to light.  Cardiovascular:     Rate and Rhythm: Normal rate and regular rhythm.  Pulmonary:     Effort:  Pulmonary effort is normal.     Breath sounds: Normal breath sounds.  Abdominal:     General: Bowel sounds are normal.     Palpations: Abdomen is soft.     Tenderness: There is no abdominal tenderness.  Musculoskeletal:        General: Normal range of motion.     Cervical back: Neck supple.  Skin:    General: Skin is warm and dry.  Neurological:     Mental Status: She is alert  and oriented to person, place, and time.     Cranial Nerves: No cranial nerve deficit.     Deep Tendon Reflexes:     Reflex Scores:      Patellar reflexes are 2+ on the right side and 2+ on the left side. Psychiatric:        Mood and Affect: Mood normal.            Assessment & Plan:

## 2020-05-22 NOTE — Assessment & Plan Note (Signed)
Chronic and ongoing.  Differential diagnoses include inefficient sleep, perimenopause, OSA (less likely), metabolic cause.  Recent labs including TSH, CBC, CMP unremarkable. We discussed potentially coming off oral contraceptive pills next year given her age.  We will check vitamin B 12 and vitamin D levels today. She will work on her sleep patterns, she does feel better during the weekends when she is able to sleep longer.  She will update.

## 2020-05-23 LAB — VITAMIN B12: Vitamin B-12: 395 pg/mL (ref 211–911)

## 2020-05-23 LAB — VITAMIN D 25 HYDROXY (VIT D DEFICIENCY, FRACTURES): VITD: 89.54 ng/mL (ref 30.00–100.00)

## 2020-07-18 ENCOUNTER — Other Ambulatory Visit: Payer: Self-pay | Admitting: Primary Care

## 2020-07-18 DIAGNOSIS — J3089 Other allergic rhinitis: Secondary | ICD-10-CM

## 2020-07-18 DIAGNOSIS — F411 Generalized anxiety disorder: Secondary | ICD-10-CM

## 2020-07-27 ENCOUNTER — Ambulatory Visit (INDEPENDENT_AMBULATORY_CARE_PROVIDER_SITE_OTHER): Payer: Self-pay | Admitting: Dermatology

## 2020-07-27 ENCOUNTER — Other Ambulatory Visit: Payer: Self-pay

## 2020-07-27 DIAGNOSIS — L988 Other specified disorders of the skin and subcutaneous tissue: Secondary | ICD-10-CM

## 2020-07-27 MED ORDER — VALACYCLOVIR HCL 500 MG PO TABS: ORAL_TABLET | ORAL | 0 refills | Status: DC

## 2020-07-27 NOTE — Progress Notes (Signed)
   Follow-Up Visit   Subjective  Mary Mason is a 51 y.o. female who presents for the following: Facial Elastosis (Patient is here today for facial fillers.).  The following portions of the chart were reviewed this encounter and updated as appropriate:   Tobacco  Allergies  Meds  Problems  Med Hx  Surg Hx  Fam Hx     Review of Systems:  No other skin or systemic complaints except as noted in HPI or Assessment and Plan.  Objective  Well appearing patient in no apparent distress; mood and affect are within normal limits.  A focused examination was performed including the face. Relevant physical exam findings are noted in the Assessment and Plan.  Objective  Face: Rhytides and volume loss.   Images                              Assessment & Plan  Elastosis of skin Face  No hx of HSV, but may start Valtrex 500mg  po BID x 7 days prophylactically post procedure.   Discussed concentrating the 5 units of Botox on the forehead more laterally, and continuing the left Botox comma.  Restylane Refyne 1 syringe injected into: - B/L oral commissure - Anterior chin - L nasolabial crease  - Upper and lower lip vermilion borders   Filling material injection - Face Prior to the procedure, the patient's past medical history, allergies and the rare but potential risks and complications were reviewed with the patient and a signed consent was obtained. Pre and post-treatment care was discussed and instructions provided.  Location: lips, perioral, nasolabial folds, and chin  Filler Type: Restylane refyne  Lot Number: [id number]  Expiration Date: 01/28/2021  Procedure: Lidocaine-tetracaine ointment was applied to treatment areas to achieve good local anesthesia. The area was prepped thoroughly with Puracyn. After introducing the needle into the desired treatment area, the syringe plunger was drawn back to ensure there was no flash of blood prior to injecting the  filler in order to minimize risk of intravascular injection and vascular occlusion. After injection of the filler, the treated areas were cleansed and iced to reduce swelling. Post-treatment instructions were reviewed with the patient.       Patient tolerated the procedure well. The patient will call with any problems, questions or concerns prior to their next appointment.   Ordered Medications: valACYclovir (VALTREX) 500 MG tablet  Return for cosmetic appointment in 2-3 months for Botox.  Luther Redo, CMA, am acting as scribe for Sarina Ser, MD .  Documentation: I have reviewed the above documentation for accuracy and completeness, and I agree with the above.  Sarina Ser, MD

## 2020-07-28 ENCOUNTER — Encounter: Payer: Self-pay | Admitting: Dermatology

## 2020-09-12 ENCOUNTER — Ambulatory Visit: Payer: 59 | Admitting: Dermatology

## 2020-10-02 ENCOUNTER — Ambulatory Visit
Admission: RE | Admit: 2020-10-02 | Discharge: 2020-10-02 | Disposition: A | Discharge status: Home / Self Care | Payer: 59 | Source: Ambulatory Visit | Attending: Primary Care | Admitting: Primary Care

## 2020-10-02 ENCOUNTER — Other Ambulatory Visit: Payer: Self-pay

## 2020-10-02 DIAGNOSIS — Z1231 Encounter for screening mammogram for malignant neoplasm of breast: Secondary | ICD-10-CM | POA: Diagnosis present

## 2020-12-07 ENCOUNTER — Other Ambulatory Visit: Payer: Self-pay | Admitting: Primary Care

## 2020-12-07 DIAGNOSIS — J3089 Other allergic rhinitis: Secondary | ICD-10-CM

## 2021-01-03 ENCOUNTER — Other Ambulatory Visit: Payer: Self-pay | Admitting: Primary Care

## 2021-01-03 ENCOUNTER — Ambulatory Visit (INDEPENDENT_AMBULATORY_CARE_PROVIDER_SITE_OTHER): Payer: 59 | Admitting: Primary Care

## 2021-01-03 ENCOUNTER — Encounter: Payer: Self-pay | Admitting: Primary Care

## 2021-01-03 ENCOUNTER — Other Ambulatory Visit: Payer: Self-pay

## 2021-01-03 VITALS — BP 120/74 | HR 76 | Temp 98.0°F | Ht 65.0 in | Wt 143.0 lb

## 2021-01-03 DIAGNOSIS — F411 Generalized anxiety disorder: Secondary | ICD-10-CM | POA: Diagnosis not present

## 2021-01-03 DIAGNOSIS — R5383 Other fatigue: Secondary | ICD-10-CM | POA: Diagnosis not present

## 2021-01-03 MED ORDER — ESCITALOPRAM OXALATE 10 MG PO TABS: 10.0000 mg | ORAL_TABLET | Freq: Every day | ORAL | 0 refills | Status: DC

## 2021-01-03 NOTE — Assessment & Plan Note (Signed)
Chronic, unclear etiology.  She doesn't fit the clinical picture for sleep apnea, also doesn't snore.   Could be anxiety related, could also be perimenopause. Will work to treat anxiety and follow her closely.   Prior lab work up negative for metabolic cause.

## 2021-01-03 NOTE — Progress Notes (Signed)
Subjective:    Patient ID: Mary Mason, female    DOB: 09/28/1970, 50 y.o.   MRN: [id number]  HPI  Mary Mason is a very pleasant 50 y.o. female who presents today to discuss fatigue and anxiety.    She's struggled with fatigue and anxiety over the last 3 months.   She's dreading her upcoming birthday, will be 50 years old this year.   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 She's thinking worst case scenario and worrying about why these clients left.    She's always thinking about what others are doing, doesn't feel as though she has many close friends to talk with.   She is frustrated with her chronic fatigue which only hits her when she rests or sits down. Fatigue hits her in the late afternoon, she wakes up feeling energized and feels well rested in the morning, wakes at 4 am. She can fall asleep at any point when resting/sitting, has felt this way when driving her car. She sleeps soundly, does not wake during the night, sleeps deeply. She does not snore.   She exercises regularly 60 min-120 min of exercise daily. She eats a healthy diet. Is also frustrated with her weight, clothes feel tighter.   She's compliant to her Zoloft 50 mg, has been taking for 12 years, has been taking this every other day for three months. She was once on Zoloft 100 mg, reduced back down as things "calmed down". She has no sexual libido.   She is compliant to OCP's, hasn't noticed any big signs of perimenopause.   Wt Readings from Last 3 Encounters:  01/03/21 143 lb (64.9 kg)  05/22/20 140 lb (63.5 kg)  05/21/19 137 lb 8 oz (62.4 kg)      Review of Systems  Constitutional:  Positive for fatigue.  Psychiatric/Behavioral:  The patient is nervous/anxious.        See HPI        Past Medical History:  Diagnosis Date   Arthritis    Cellulitis    Chilblain lupus erythematosus    Chilblains    Depression    GAD (generalized anxiety  disorder)     Social History   Socioeconomic History   Marital status: Single    Spouse name: Not on file   Number of children: Not on file   Years of education: Not on file   Highest education level: Not on file  Occupational History   Not on file  Tobacco Use   Smoking status: Never   Smokeless tobacco: Never  Substance and Sexual Activity   Alcohol use: Yes    Alcohol/week: 0.0 standard drinks    Comment: occasional   Drug use: No   Sexual activity: Yes    Partners: Male    Birth control/protection: Pill  Other Topics Concern   Not on file  Social History Narrative   Divorced   Employed as a Dance movement psychotherapist   No children.   Enjoys exercising, spending time with friends.   Social Determinants of Health   Financial Resource Strain: Not on file  Food Insecurity: Not on file  Transportation Needs: Not on file  Physical Activity: Not on file  Stress: Not on file  Social Connections: Not on file  Intimate Partner Violence: Not on file    Past Surgical History:  Procedure Laterality Date   AUGMENTATION MAMMAPLASTY     BREAST ENHANCEMENT SURGERY  2002  TONSILECTOMY, ADENOIDECTOMY, BILATERAL MYRINGOTOMY AND TUBES      Family History  Problem Relation Age of Onset   Hearing loss Maternal Grandmother    Cancer Maternal Grandmother        Liver cancer   Thyroid nodules Mother    Cancer Maternal Aunt 92       Breast cancer- died at 46   Breast cancer Maternal Aunt    Breast cancer Cousin     No Known Allergies  Current Outpatient Medications on File Prior to Visit  Medication Sig Dispense Refill   cetirizine (ZYRTEC) 10 MG tablet Take 1 tablet (10 mg total) by mouth daily. For allergies. 90 tablet 3   Cholecalciferol (D3 ADULT PO) Take 5,000 mg by mouth daily.     COLLAGEN PO Take by mouth.     fluticasone (FLONASE) 50 MCG/ACT nasal spray SHAKE LIQUID AND USE 2 SPRAYS IN EACH NOSTRIL DAILY 16 g 2   Levonorgestrel-Ethinyl Estradiol (SIMPESSE)  0.15-0.03 &0.01 MG tablet Take 1 tablet by mouth daily. 91 tablet 3   Multiple Vitamins-Minerals (MULTIVITAMIN WITH MINERALS) tablet Take 1 tablet by mouth daily.     OVER THE COUNTER MEDICATION Celadrin     sertraline (ZOLOFT) 50 MG tablet TAKE 1 TABLET(50 MG) BY MOUTH DAILY FOR ANXIETY 90 tablet 3   TURMERIC PO Take by mouth.     No current facility-administered medications on file prior to visit.    BP 120/74   Pulse 76   Temp 98 F (36.7 C) (Temporal)   Ht 5\' 5"  (1.651 m)   Wt 143 lb (64.9 kg)   SpO2 100%   BMI 23.80 kg/m  Objective:   Physical Exam Cardiovascular:     Rate and Rhythm: Normal rate and regular rhythm.  Pulmonary:     Effort: Pulmonary effort is normal.     Breath sounds: Normal breath sounds.  Musculoskeletal:     Cervical back: Neck supple.  Skin:    General: Skin is warm and dry.  Psychiatric:        Mood and Affect: Mood normal.          Assessment & Plan:      This visit occurred during the SARS-CoV-2 public health emergency.  Safety protocols were in place, including screening questions prior to the visit, additional usage of staff PPE, and extensive cleaning of exam room while observing appropriate contact time as indicated for disinfecting solutions.

## 2021-01-03 NOTE — Patient Instructions (Signed)
Stop sertraline (Zoloft) for anxiety.  Start escitalopram (Lexapro) for anxiety.   You will be contacted regarding your referral to therapy.  Please let us know if you have not been contacted within two weeks.   Please schedule a follow up visit for 6 weeks for follow up.   It was a pleasure to see you today!

## 2021-01-03 NOTE — Assessment & Plan Note (Signed)
Do suspect that part of her fatigue is her anxiety/anxiety symptoms. She could also be starting perimenopause.  She's been on Zoloft for 12 years, will trial different treatment. Stop Zoloft, start Lexapro 10 mg. She also agrees to therapy, referral placed.  We will see her back in 6 weeks.

## 2021-02-13 ENCOUNTER — Encounter: Payer: Self-pay | Admitting: *Deleted

## 2021-02-15 NOTE — Telephone Encounter (Signed)
FYI

## 2021-02-20 ENCOUNTER — Ambulatory Visit: Payer: 59 | Admitting: Primary Care

## 2021-03-29 ENCOUNTER — Other Ambulatory Visit: Payer: Self-pay | Admitting: Primary Care

## 2021-03-29 DIAGNOSIS — F411 Generalized anxiety disorder: Secondary | ICD-10-CM

## 2021-03-31 ENCOUNTER — Other Ambulatory Visit: Payer: Self-pay | Admitting: Primary Care

## 2021-03-31 DIAGNOSIS — J3089 Other allergic rhinitis: Secondary | ICD-10-CM

## 2021-05-09 ENCOUNTER — Encounter: Payer: Self-pay | Admitting: Primary Care

## 2021-05-28 ENCOUNTER — Encounter: Payer: 59 | Admitting: Primary Care

## 2021-05-29 ENCOUNTER — Other Ambulatory Visit: Payer: Self-pay | Admitting: Primary Care

## 2021-05-29 DIAGNOSIS — Z124 Encounter for screening for malignant neoplasm of cervix: Secondary | ICD-10-CM

## 2021-05-30 ENCOUNTER — Encounter: Payer: Self-pay | Admitting: Primary Care

## 2021-06-20 ENCOUNTER — Other Ambulatory Visit: Payer: Self-pay

## 2021-06-20 ENCOUNTER — Ambulatory Visit (INDEPENDENT_AMBULATORY_CARE_PROVIDER_SITE_OTHER): Payer: 59 | Admitting: Primary Care

## 2021-06-20 ENCOUNTER — Encounter: Payer: Self-pay | Admitting: Primary Care

## 2021-06-20 VITALS — BP 130/76 | HR 96 | Temp 98.3°F | Ht 65.0 in | Wt 138.0 lb

## 2021-06-20 DIAGNOSIS — J3089 Other allergic rhinitis: Secondary | ICD-10-CM

## 2021-06-20 DIAGNOSIS — Z1211 Encounter for screening for malignant neoplasm of colon: Secondary | ICD-10-CM | POA: Diagnosis not present

## 2021-06-20 DIAGNOSIS — F411 Generalized anxiety disorder: Secondary | ICD-10-CM

## 2021-06-20 DIAGNOSIS — Z0000 Encounter for general adult medical examination without abnormal findings: Secondary | ICD-10-CM

## 2021-06-20 DIAGNOSIS — R5383 Other fatigue: Secondary | ICD-10-CM

## 2021-06-20 DIAGNOSIS — Z1159 Encounter for screening for other viral diseases: Secondary | ICD-10-CM

## 2021-06-20 LAB — LIPID PANEL
Cholesterol: 162 mg/dL (ref 0–200)
HDL: 80 mg/dL (ref >39.00)
LDL Cholesterol: 69 mg/dL (ref 0–99)
NonHDL: 81.85
Total CHOL/HDL Ratio: 2
Triglycerides: 62 mg/dL (ref 0.0–149.0)
VLDL: 12.4 mg/dL (ref 0.0–40.0)

## 2021-06-20 LAB — COMPREHENSIVE METABOLIC PANEL
ALT: 30 U/L (ref 0–35)
AST: 41 U/L — ABNORMAL HIGH (ref 0–37)
Albumin: 4.1 g/dL (ref 3.5–5.2)
Alkaline Phosphatase: 32 U/L — ABNORMAL LOW (ref 39–117)
BUN: 15 mg/dL (ref 6–23)
CO2: 28 mEq/L (ref 19–32)
Calcium: 9.5 mg/dL (ref 8.4–10.5)
Chloride: 102 mEq/L (ref 96–112)
Creatinine, Ser: 0.82 mg/dL (ref 0.40–1.20)
GFR: 83.42 mL/min (ref >60.00)
Glucose, Bld: 88 mg/dL (ref 70–99)
Potassium: 4.2 mEq/L (ref 3.5–5.1)
Sodium: 135 mEq/L (ref 135–145)
Total Bilirubin: 0.5 mg/dL (ref 0.2–1.2)
Total Protein: 7 g/dL (ref 6.0–8.3)

## 2021-06-20 LAB — VITAMIN D 25 HYDROXY (VIT D DEFICIENCY, FRACTURES): VITD: 62.6 ng/mL (ref 30.00–100.00)

## 2021-06-20 LAB — CBC
HCT: 37.1 % (ref 36.0–46.0)
Hemoglobin: 12.2 g/dL (ref 12.0–15.0)
MCHC: 32.9 g/dL (ref 30.0–36.0)
MCV: 86.8 fl (ref 78.0–100.0)
Platelets: 281 10*3/uL (ref 150.0–400.0)
RBC: 4.28 Mil/uL (ref 3.87–5.11)
RDW: 14 % (ref 11.5–15.5)
WBC: 4.8 10*3/uL (ref 4.0–10.5)

## 2021-06-20 LAB — VITAMIN B12: Vitamin B-12: 502 pg/mL (ref 211–911)

## 2021-06-20 NOTE — Progress Notes (Signed)
Subjective:    Patient ID: Mary Mason, female    DOB: 25-Aug-1970, 50 y.o.   MRN: [id number]  HPI  Mary Mason is a very pleasant 50 y.o. female who presents today for complete physical and follow up of chronic conditions.  Immunizations: -Tetanus: 2019 -Influenza: Completed this season  -Covid-19: 2 vaccines  -Shingles: Never completed, declines   Diet: Healthy diet Exercise: Exercises 5-6 days weekly   Eye exam: Completes annually  Dental exam: Completes semi-annually   Pap Smear: Completed in 2020 Mammogram: Completed in April 2022 Colonoscopy: Completed in 2012, due  BP Readings from Last 3 Encounters:  06/20/21 130/76  01/03/21 120/74  05/22/20 118/62   Wt Readings from Last 3 Encounters:  06/20/21 138 lb (62.6 kg)  01/03/21 143 lb (64.9 kg)  05/22/20 140 lb (63.5 kg)          Review of Systems  Constitutional:  Negative for unexpected weight change.  HENT:  Negative for rhinorrhea.   Respiratory:  Negative for cough and shortness of breath.   Cardiovascular:  Negative for chest pain.  Gastrointestinal:  Negative for constipation and diarrhea.  Genitourinary:  Negative for difficulty urinating.  Musculoskeletal:  Negative for arthralgias and myalgias.  Skin:  Negative for rash.  Allergic/Immunologic: Negative for environmental allergies.  Neurological:  Negative for dizziness and headaches.  Psychiatric/Behavioral:  The patient is not nervous/anxious.         Past Medical History:  Diagnosis Date   Arthritis    Cellulitis    Chilblain lupus erythematosus    Chilblains    Depression    GAD (generalized anxiety disorder)     Social History   Socioeconomic History   Marital status: Single    Spouse name: Not on file   Number of children: Not on file   Years of education: Not on file   Highest education level: Not on file  Occupational History   Not on file  Tobacco Use   Smoking status: Never   Smokeless tobacco: Never   Substance and Sexual Activity   Alcohol use: Yes    Alcohol/week: 0.0 standard drinks    Comment: occasional   Drug use: No   Sexual activity: Yes    Partners: Male    Birth control/protection: Pill  Other Topics Concern   Not on file  Social History Narrative   Divorced   Employed as a Dance movement psychotherapist   No children.   Enjoys exercising, spending time with friends.   Social Determinants of Health   Financial Resource Strain: Not on file  Food Insecurity: Not on file  Transportation Needs: Not on file  Physical Activity: Not on file  Stress: Not on file  Social Connections: Not on file  Intimate Partner Violence: Not on file    Past Surgical History:  Procedure Laterality Date   AUGMENTATION MAMMAPLASTY     BREAST ENHANCEMENT SURGERY  2002   TONSILECTOMY, ADENOIDECTOMY, BILATERAL MYRINGOTOMY AND TUBES      Family History  Problem Relation Age of Onset   Hearing loss Maternal Grandmother    Cancer Maternal Grandmother        Liver cancer   Thyroid nodules Mother    Cancer Maternal Aunt 22       Breast cancer- died at 45   Breast cancer Maternal Aunt    Breast cancer Cousin     No Known Allergies  Current Outpatient Medications on File Prior to Visit  Medication Sig Dispense  Refill   cetirizine (ZYRTEC) 10 MG tablet Take 1 tablet (10 mg total) by mouth daily. For allergies. 90 tablet 3   Cholecalciferol (D3 ADULT PO) Take 5,000 mg by mouth daily.     COLLAGEN PO Take by mouth.     escitalopram (LEXAPRO) 10 MG tablet TAKE 1 TABLET(10 MG) BY MOUTH DAILY FOR ANXIETY 90 tablet 0   fluticasone (FLONASE) 50 MCG/ACT nasal spray SHAKE LIQUID AND USE 2 SPRAYS IN EACH NOSTRIL DAILY 48 g 1   Levonorgestrel-Ethinyl Estradiol (DAYSEE) 0.15-0.03 &0.01 MG tablet TAKE 1 TABLET BY MOUTH DAILY 91 tablet 0   Multiple Vitamins-Minerals (MULTIVITAMIN WITH MINERALS) tablet Take 1 tablet by mouth daily.     OVER THE COUNTER MEDICATION Celadrin     TURMERIC PO Take by mouth.      No current facility-administered medications on file prior to visit.    BP 130/76    Pulse 96    Temp 98.3 F (36.8 C) (Temporal)    Ht 5\' 5"  (1.651 m)    Wt 138 lb (62.6 kg)    SpO2 96%    BMI 22.96 kg/m  Objective:   Physical Exam HENT:     Right Ear: Tympanic membrane and ear canal normal.     Left Ear: Tympanic membrane and ear canal normal.     Nose: Nose normal.  Eyes:     Conjunctiva/sclera: Conjunctivae normal.     Pupils: Pupils are equal, round, and reactive to light.  Neck:     Thyroid: No thyromegaly.  Cardiovascular:     Rate and Rhythm: Normal rate and regular rhythm.     Heart sounds: No murmur heard. Pulmonary:     Effort: Pulmonary effort is normal.     Breath sounds: Normal breath sounds. No rales.  Abdominal:     General: Bowel sounds are normal.     Palpations: Abdomen is soft.     Tenderness: There is no abdominal tenderness.  Musculoskeletal:        General: Normal range of motion.     Cervical back: Neck supple.  Lymphadenopathy:     Cervical: No cervical adenopathy.  Skin:    General: Skin is warm and dry.     Findings: No rash.  Neurological:     Mental Status: She is alert and oriented to person, place, and time.     Cranial Nerves: No cranial nerve deficit.     Deep Tendon Reflexes: Reflexes are normal and symmetric.  Psychiatric:        Mood and Affect: Mood normal.          Assessment & Plan:      This visit occurred during the SARS-CoV-2 public health emergency.  Safety protocols were in place, including screening questions prior to the visit, additional usage of staff PPE, and extensive cleaning of exam room while observing appropriate contact time as indicated for disinfecting solutions.

## 2021-06-20 NOTE — Assessment & Plan Note (Signed)
Declines Shingrix today, but will obtain after the holidays. Other vaccines UTD.  Pap smear UTD. Mammogram UTD. Colonoscopy due, referral placed.  Commended her on a healthy lifestyle.   Exam today stable. Labs pending.

## 2021-06-20 NOTE — Assessment & Plan Note (Signed)
Stable, doing well on PRN Zyrtec 10 mg and Flonase.  Continue same.

## 2021-06-20 NOTE — Patient Instructions (Signed)
Stop by the lab prior to leaving today. I will notify you of your results once received.   You will be contacted regarding your referral to GI for the colonoscopy.  Please let us know if you have not been contacted within two weeks.   It was a pleasure to see you today!  Preventive Care 37-50 Years Old, Female Preventive care refers to lifestyle choices and visits with your health care provider that can promote health and wellness. Preventive care visits are also called wellness exams. What can I expect for my preventive care visit? Counseling Your health care provider may ask you questions about your: Medical history, including: Past medical problems. Family medical history. Pregnancy history. Current health, including: Menstrual cycle. Method of birth control. Emotional well-being. Home life and relationship well-being. Sexual activity and sexual health. Lifestyle, including: Alcohol, nicotine or tobacco, and drug use. Access to firearms. Diet, exercise, and sleep habits. Work and work Statistician. Sunscreen use. Safety issues such as seatbelt and bike helmet use. Physical exam Your health care provider will check your: Height and weight. These may be used to calculate your BMI (body mass index). BMI is a measurement that tells if you are at a healthy weight. Waist circumference. This measures the distance around your waistline. This measurement also tells if you are at a healthy weight and may help predict your risk of certain diseases, such as type 2 diabetes and high blood pressure. Heart rate and blood pressure. Body temperature. Skin for abnormal spots. What immunizations do I need? Vaccines are usually given at various ages, according to a schedule. Your health care provider will recommend vaccines for you based on your age, medical history, and lifestyle or other factors, such as travel or where you work. What tests do I need? Screening Your health care provider may  recommend screening tests for certain conditions. This may include: Lipid and cholesterol levels. Diabetes screening. This is done by checking your blood sugar (glucose) after you have not eaten for a while (fasting). Pelvic exam and Pap test. Hepatitis B test. Hepatitis C test. HIV (human immunodeficiency virus) test. STI (sexually transmitted infection) testing, if you are at risk. Lung cancer screening. Colorectal cancer screening. Mammogram. Talk with your health care provider about when you should start having regular mammograms. This may depend on whether you have a family history of breast cancer. BRCA-related cancer screening. This may be done if you have a family history of breast, ovarian, tubal, or peritoneal cancers. Bone density scan. This is done to screen for osteoporosis. Talk with your health care provider about your test results, treatment options, and if necessary, the need for more tests. Follow these instructions at home: Eating and drinking  Eat a diet that includes fresh fruits and vegetables, whole grains, lean protein, and low-fat dairy products. Take vitamin and mineral supplements as recommended by your health care provider. Do not drink alcohol if: Your health care provider tells you not to drink. You are pregnant, may be pregnant, or are planning to become pregnant. If you drink alcohol: Limit how much you have to 0-1 drink a day. Know how much alcohol is in your drink. In the [place] one drink equals one 12 oz bottle of beer (355 mL), one 5 oz glass of wine (148 mL), or one 1 oz glass of hard liquor (44 mL). Lifestyle Brush your teeth every morning and night with fluoride toothpaste. Floss one time each day. Exercise for at least 30 minutes 5 or more days  each week. Do not use any products that contain nicotine or tobacco. These products include cigarettes, chewing tobacco, and vaping devices, such as e-cigarettes. If you need help quitting, ask your health  care provider. Do not use drugs. If you are sexually active, practice safe sex. Use a condom or other form of protection to prevent STIs. If you do not wish to become pregnant, use a form of birth control. If you plan to become pregnant, see your health care provider for a prepregnancy visit. Take aspirin only as told by your health care provider. Make sure that you understand how much to take and what form to take. Work with your health care provider to find out whether it is safe and beneficial for you to take aspirin daily. Find healthy ways to manage stress, such as: Meditation, yoga, or listening to music. Journaling. Talking to a trusted person. Spending time with friends and family. Minimize exposure to UV radiation to reduce your risk of skin cancer. Safety Always wear your seat belt while driving or riding in a vehicle. Do not drive: If you have been drinking alcohol. Do not ride with someone who has been drinking. When you are tired or distracted. While texting. If you have been using any mind-altering substances or drugs. Wear a helmet and other protective equipment during sports activities. If you have firearms in your house, make sure you follow all gun safety procedures. Seek help if you have been physically or sexually abused. What's next? Visit your health care provider once a year for an annual wellness visit. Ask your health care provider how often you should have your eyes and teeth checked. Stay up to date on all vaccines. This information is not intended to replace advice given to you by your health care provider. Make sure you discuss any questions you have with your health care provider. Document Revised: 12/13/2020 Document Reviewed: 12/13/2020 Elsevier Patient Education  [employer].

## 2021-06-20 NOTE — Assessment & Plan Note (Signed)
No concerns today 

## 2021-06-20 NOTE — Assessment & Plan Note (Signed)
Stable, doing well on Lexapro 10 mg, continue same.

## 2021-06-21 LAB — HEPATITIS C ANTIBODY
Hepatitis C Ab: NONREACTIVE
SIGNAL TO CUT-OFF: 0.05 (ref <1.00)

## 2021-06-24 ENCOUNTER — Other Ambulatory Visit: Payer: Self-pay | Admitting: Primary Care

## 2021-06-24 DIAGNOSIS — J3089 Other allergic rhinitis: Secondary | ICD-10-CM

## 2021-06-24 DIAGNOSIS — F411 Generalized anxiety disorder: Secondary | ICD-10-CM

## 2021-06-28 ENCOUNTER — Encounter: Payer: 59 | Admitting: Primary Care

## 2021-07-04 ENCOUNTER — Telehealth: Payer: Self-pay

## 2021-07-04 NOTE — Telephone Encounter (Signed)
CALLED PATIENT NO ANSWER LEFT VOICEMAIL FOR A CALL BACK °Letter sent °

## 2021-07-04 NOTE — Telephone Encounter (Signed)
CALLED PATIENT NO ANSWER LEFT VOICEMAIL FOR A CALL BACK ? ?

## 2021-07-10 ENCOUNTER — Ambulatory Visit (INDEPENDENT_AMBULATORY_CARE_PROVIDER_SITE_OTHER): Payer: 59

## 2021-07-10 ENCOUNTER — Other Ambulatory Visit: Payer: Self-pay

## 2021-07-10 DIAGNOSIS — Z23 Encounter for immunization: Secondary | ICD-10-CM

## 2021-08-06 ENCOUNTER — Other Ambulatory Visit: Payer: Self-pay | Admitting: Primary Care

## 2021-08-06 DIAGNOSIS — Z124 Encounter for screening for malignant neoplasm of cervix: Secondary | ICD-10-CM

## 2021-08-06 MED ORDER — LEVONORGEST-ETH ESTRAD 91-DAY 0.15-0.03 &0.01 MG PO TABS: 1.0000 | ORAL_TABLET | Freq: Every day | ORAL | 2 refills | Status: DC

## 2021-09-25 ENCOUNTER — Other Ambulatory Visit: Payer: Self-pay

## 2021-09-25 ENCOUNTER — Ambulatory Visit (INDEPENDENT_AMBULATORY_CARE_PROVIDER_SITE_OTHER): Payer: 59

## 2021-09-25 DIAGNOSIS — Z23 Encounter for immunization: Secondary | ICD-10-CM | POA: Diagnosis not present

## 2021-09-25 NOTE — Progress Notes (Signed)
Pt came in today to recevie her shingrix vaccine . Pt was given Shingrix injection the  right deltoid IM  successfully w/o any concerns. ?

## 2021-10-26 ENCOUNTER — Other Ambulatory Visit: Payer: Self-pay

## 2021-10-26 DIAGNOSIS — J3089 Other allergic rhinitis: Secondary | ICD-10-CM

## 2021-10-26 MED ORDER — FLUTICASONE PROPIONATE 50 MCG/ACT NA SUSP: NASAL | 0 refills | Status: DC

## 2021-11-04 DIAGNOSIS — J3089 Other allergic rhinitis: Secondary | ICD-10-CM

## 2021-11-06 MED ORDER — LEVOCETIRIZINE DIHYDROCHLORIDE 5 MG PO TABS: 5.0000 mg | ORAL_TABLET | Freq: Every evening | ORAL | 0 refills | Status: DC

## 2022-01-11 ENCOUNTER — Other Ambulatory Visit: Payer: Self-pay | Admitting: Primary Care

## 2022-01-11 DIAGNOSIS — J3089 Other allergic rhinitis: Secondary | ICD-10-CM

## 2022-02-09 ENCOUNTER — Other Ambulatory Visit: Payer: Self-pay | Admitting: Primary Care

## 2022-02-09 DIAGNOSIS — J3089 Other allergic rhinitis: Secondary | ICD-10-CM

## 2022-03-20 ENCOUNTER — Ambulatory Visit: Payer: 59 | Admitting: Dermatology

## 2022-03-20 DIAGNOSIS — D229 Melanocytic nevi, unspecified: Secondary | ICD-10-CM | POA: Diagnosis not present

## 2022-03-20 DIAGNOSIS — Z1283 Encounter for screening for malignant neoplasm of skin: Secondary | ICD-10-CM | POA: Diagnosis not present

## 2022-03-20 DIAGNOSIS — D1800 Hemangioma unspecified site: Secondary | ICD-10-CM

## 2022-03-20 DIAGNOSIS — L821 Other seborrheic keratosis: Secondary | ICD-10-CM | POA: Diagnosis not present

## 2022-03-20 DIAGNOSIS — L84 Corns and callosities: Secondary | ICD-10-CM | POA: Diagnosis not present

## 2022-03-20 DIAGNOSIS — L578 Other skin changes due to chronic exposure to nonionizing radiation: Secondary | ICD-10-CM | POA: Diagnosis not present

## 2022-03-20 DIAGNOSIS — L818 Other specified disorders of pigmentation: Secondary | ICD-10-CM

## 2022-03-20 DIAGNOSIS — L814 Other melanin hyperpigmentation: Secondary | ICD-10-CM | POA: Diagnosis not present

## 2022-03-20 DIAGNOSIS — S50861A Insect bite (nonvenomous) of right forearm, initial encounter: Secondary | ICD-10-CM

## 2022-03-20 DIAGNOSIS — W57XXXA Bitten or stung by nonvenomous insect and other nonvenomous arthropods, initial encounter: Secondary | ICD-10-CM

## 2022-03-20 MED ORDER — MUPIROCIN 2 % EX OINT: TOPICAL_OINTMENT | CUTANEOUS | 0 refills | Status: DC

## 2022-03-20 NOTE — Patient Instructions (Signed)
Due to recent changes in healthcare laws, you may see results of your pathology and/or laboratory studies on MyChart before the doctors have had a chance to review them. We understand that in some cases there may be results that are confusing or concerning to you. Please understand that not all results are received at the same time and often the doctors may need to interpret multiple results in order to provide you with the best plan of care or course of treatment. Therefore, we ask that you please give us 2 business days to thoroughly review all your results before contacting the office for clarification. Should we see a critical lab result, you will be contacted sooner.   If You Need Anything After Your Visit  If you have any questions or concerns for your doctor, please call our main line at [phone] and press option 4 to reach your doctor's medical assistant. If no one answers, please leave a voicemail as directed and we will return your call as soon as possible. Messages left after 4 pm will be answered the following business day.   You may also send us a message via MyChart. We typically respond to MyChart messages within 1-2 business days.  For prescription refills, please ask your pharmacy to contact our office. Our fax number is [phone].  If you have an urgent issue when the clinic is closed that cannot wait until the next business day, you can page your doctor at the number below.    Please note that while we do our best to be available for urgent issues outside of office hours, we are not available 24/7.   If you have an urgent issue and are unable to reach us, you may choose to seek medical care at your doctor's office, retail clinic, urgent care center, or emergency room.  If you have a medical emergency, please immediately call 911 or go to the emergency department.  Pager Numbers  - Dr. Kowalski: [phone]  - Dr. Moye: [phone]  - Dr. Stewart:  [phone]  In the event of inclement weather, please call our main line at [phone] for an update on the status of any delays or closures.  Dermatology Medication Tips: Please keep the boxes that topical medications come in in order to help keep track of the instructions about where and how to use these. Pharmacies typically print the medication instructions only on the boxes and not directly on the medication tubes.   If your medication is too expensive, please contact our office at [phone] option 4 or send us a message through MyChart.   We are unable to tell what your co-pay for medications will be in advance as this is different depending on your insurance coverage. However, we may be able to find a substitute medication at lower cost or fill out paperwork to get insurance to cover a needed medication.   If a prior authorization is required to get your medication covered by your insurance company, please allow us 1-2 business days to complete this process.  Drug prices often vary depending on where the prescription is filled and some pharmacies may offer cheaper prices.  The website www.goodrx.com contains coupons for medications through different pharmacies. The prices here do not account for what the cost may be with help from insurance (it may be cheaper with your insurance), but the website can give you the price if you did not use any insurance.  - You can print the associated coupon and take it with   your prescription to the pharmacy.  - You may also stop by our office during regular business hours and pick up a GoodRx coupon card.  - If you need your prescription sent electronically to a different pharmacy, notify our office through [facility] MyChart or by phone at [phone] option 4.     Si Usted Necesita Algo Despus de Su Visita  Tambin puede enviarnos un mensaje a travs de MyChart. Por lo general respondemos a los mensajes de MyChart en el transcurso de 1 a 2  das hbiles.  Para renovar recetas, por favor pida a su farmacia que se ponga en contacto con nuestra oficina. Nuestro nmero de fax es el [phone].  Si tiene un asunto urgente cuando la clnica est cerrada y que no puede esperar hasta el siguiente da hbil, puede llamar/localizar a su doctor(a) al nmero que aparece a continuacin.   Por favor, tenga en cuenta que aunque hacemos todo lo posible para estar disponibles para asuntos urgentes fuera del horario de oficina, no estamos disponibles las 24 horas del da, los 7 das de la semana.   Si tiene un problema urgente y no puede comunicarse con nosotros, puede optar por buscar atencin mdica  en el consultorio de su doctor(a), en una clnica privada, en un centro de atencin urgente o en una sala de emergencias.  Si tiene una emergencia mdica, por favor llame inmediatamente al 911 o vaya a la sala de emergencias.  Nmeros de bper  - Dr. Kowalski: [phone]  - Dra. Moye: [phone]  - Dra. Stewart: [phone]  En caso de inclemencias del tiempo, por favor llame a nuestra lnea principal al [phone] para una actualizacin sobre el estado de cualquier retraso o cierre.  Consejos para la medicacin en dermatologa: Por favor, guarde las cajas en las que vienen los medicamentos de uso tpico para ayudarle a seguir las instrucciones sobre dnde y cmo usarlos. Las farmacias generalmente imprimen las instrucciones del medicamento slo en las cajas y no directamente en los tubos del medicamento.   Si su medicamento es muy caro, por favor, pngase en contacto con nuestra oficina llamando al [phone] y presione la opcin 4 o envenos un mensaje a travs de MyChart.   No podemos decirle cul ser su copago por los medicamentos por adelantado ya que esto es diferente dependiendo de la cobertura de su seguro. Sin embargo, es posible que podamos encontrar un medicamento sustituto a menor costo o llenar un formulario para que el  seguro cubra el medicamento que se considera necesario.   Si se requiere una autorizacin previa para que su compaa de seguros cubra su medicamento, por favor permtanos de 1 a 2 das hbiles para completar este proceso.  Los precios de los medicamentos varan con frecuencia dependiendo del lugar de dnde se surte la receta y alguna farmacias pueden ofrecer precios ms baratos.  El sitio web www.goodrx.com tiene cupones para medicamentos de diferentes farmacias. Los precios aqu no tienen en cuenta lo que podra costar con la ayuda del seguro (puede ser ms barato con su seguro), pero el sitio web puede darle el precio si no utiliz ningn seguro.  - Puede imprimir el cupn correspondiente y llevarlo con su receta a la farmacia.  - Tambin puede pasar por nuestra oficina durante el horario de atencin regular y recoger una tarjeta de cupones de GoodRx.  - Si necesita que su receta se enve electrnicamente a una farmacia diferente, informe a nuestra oficina a travs de MyChart de [place]   o por telfono llamando al [phone] y presione la opcin 4.  

## 2022-03-20 NOTE — Progress Notes (Unsigned)
   Follow-Up Visit   Subjective  Mary SCHABEN is a 51 y.o. female who presents for the following: Annual Exam. The patient presents for Total-Body Skin Exam (TBSE) for skin cancer screening and mole check.  The patient has spots, moles and lesions to be evaluated, some may be new or changing and the patient has concerns that these could be cancer.  The following portions of the chart were reviewed this encounter and updated as appropriate:   Tobacco  Allergies  Meds  Problems  Med Hx  Surg Hx  Fam Hx     Review of Systems:  No other skin or systemic complaints except as noted in HPI or Assessment and Plan.  Objective  Well appearing patient in no apparent distress; mood and affect are within normal limits.  A full examination was performed including scalp, head, eyes, ears, nose, lips, neck, chest, axillae, abdomen, back, buttocks, bilateral upper extremities, bilateral lower extremities, hands, feet, fingers, toes, fingernails, and toenails. All findings within normal limits unless otherwise noted below.  R forearm Erythematous papule.  Trunk, extremities Hypopigmented macules.    L toe Thickened skin.   Assessment & Plan  Insect bite of right forearm, initial encounter R forearm Start Mupirocin 2% ointment to aa QD until healed.  mupirocin ointment (BACTROBAN) 2 % - R forearm Apply to bug bite QD until healed.  Idiopathic guttate hypomelanosis Trunk, extremities Benign-appearing.  Observation.  Call clinic for new or changing lesions.  Recommend daily use of broad spectrum spf 30+ sunscreen to sun-exposed areas.   Callus of toe L toe Due to chronic rubbing -  Recommend OTC callus treatment with protective padding. Also discussed silicone toe separators.  Lentigines - Scattered tan macules - Due to sun exposure - Benign-appearing, observe - Recommend daily broad spectrum sunscreen SPF 30+ to sun-exposed areas, reapply every 2 hours as needed. - Call for any  changes  Seborrheic Keratoses - Stuck-on, waxy, tan-brown papules and/or plaques  - Benign-appearing - Discussed benign etiology and prognosis. - Observe - Call for any changes  Melanocytic Nevi - Tan-brown and/or pink-flesh-colored symmetric macules and papules - Benign appearing on exam today - Observation - Call clinic for new or changing moles - Recommend daily use of broad spectrum spf 30+ sunscreen to sun-exposed areas.   Hemangiomas - Red papules - Discussed benign nature - Observe - Call for any changes  Actinic Damage - Chronic condition, secondary to cumulative UV/sun exposure - diffuse scaly erythematous macules with underlying dyspigmentation - Recommend daily broad spectrum sunscreen SPF 30+ to sun-exposed areas, reapply every 2 hours as needed.  - Staying in the shade or wearing long sleeves, sun glasses (UVA+UVB protection) and wide brim hats (4-inch brim around the entire circumference of the hat) are also recommended for sun protection.  - Call for new or changing lesions.  Skin cancer screening performed today.  Return if symptoms worsen or fail to improve.  Luther Redo, CMA, am acting as scribe for Sarina Ser, MD . Documentation: I have reviewed the above documentation for accuracy and completeness, and I agree with the above.  Sarina Ser, MD

## 2022-03-21 ENCOUNTER — Encounter: Payer: Self-pay | Admitting: Dermatology

## 2022-03-21 NOTE — Telephone Encounter (Signed)
Can you call her to help coordinate next week? You can add her on whenever.

## 2022-03-21 NOTE — Telephone Encounter (Signed)
Called patient set up for office visit on 9/28.  No further action needed at this time.

## 2022-03-28 ENCOUNTER — Ambulatory Visit (INDEPENDENT_AMBULATORY_CARE_PROVIDER_SITE_OTHER): Payer: 59 | Admitting: Primary Care

## 2022-03-28 ENCOUNTER — Encounter: Payer: Self-pay | Admitting: Primary Care

## 2022-03-28 VITALS — BP 110/72 | HR 66 | Temp 98.5°F | Ht 65.0 in | Wt 143.0 lb

## 2022-03-28 DIAGNOSIS — R5383 Other fatigue: Secondary | ICD-10-CM

## 2022-03-28 DIAGNOSIS — F411 Generalized anxiety disorder: Secondary | ICD-10-CM | POA: Diagnosis not present

## 2022-03-28 DIAGNOSIS — Z23 Encounter for immunization: Secondary | ICD-10-CM | POA: Diagnosis not present

## 2022-03-28 DIAGNOSIS — R69 Illness, unspecified: Secondary | ICD-10-CM | POA: Diagnosis not present

## 2022-03-28 LAB — BASIC METABOLIC PANEL
BUN: 19 mg/dL (ref 6–23)
CO2: 27 mEq/L (ref 19–32)
Calcium: 9.1 mg/dL (ref 8.4–10.5)
Chloride: 102 mEq/L (ref 96–112)
Creatinine, Ser: 0.85 mg/dL (ref 0.40–1.20)
GFR: 79.47 mL/min (ref >60.00)
Glucose, Bld: 84 mg/dL (ref 70–99)
Potassium: 4.6 mEq/L (ref 3.5–5.1)
Sodium: 136 mEq/L (ref 135–145)

## 2022-03-28 LAB — VITAMIN D 25 HYDROXY (VIT D DEFICIENCY, FRACTURES): VITD: 56.94 ng/mL (ref 30.00–100.00)

## 2022-03-28 LAB — CBC
HCT: 34.3 % — ABNORMAL LOW (ref 36.0–46.0)
Hemoglobin: 11.5 g/dL — ABNORMAL LOW (ref 12.0–15.0)
MCHC: 33.6 g/dL (ref 30.0–36.0)
MCV: 90.5 fl (ref 78.0–100.0)
Platelets: 266 10*3/uL (ref 150.0–400.0)
RBC: 3.79 Mil/uL — ABNORMAL LOW (ref 3.87–5.11)
RDW: 13.1 % (ref 11.5–15.5)
WBC: 4.7 10*3/uL (ref 4.0–10.5)

## 2022-03-28 LAB — LUTEINIZING HORMONE: LH: <0.2 m[IU]/mL

## 2022-03-28 LAB — TSH: TSH: 1.28 u[IU]/mL (ref 0.35–5.50)

## 2022-03-28 LAB — VITAMIN B12: Vitamin B-12: 514 pg/mL (ref 211–911)

## 2022-03-28 LAB — FOLLICLE STIMULATING HORMONE: FSH: <0.2 m[IU]/mL

## 2022-03-28 NOTE — Assessment & Plan Note (Addendum)
Controlled.   Continue Lexapro 10 mg daily.   I evaluated patient, was consulted regarding treatment, and agree with assessment and plan per Tinnie Gens, RN, DNP student.   Allie Bossier, NP-C

## 2022-03-28 NOTE — Patient Instructions (Signed)
Stop by the lab prior to leaving today. I will notify you of your results once received.   It was a pleasure to see you today!  

## 2022-03-28 NOTE — Assessment & Plan Note (Addendum)
Suspect this is multifactorial including daytime napping, hormonal from OCP's. Ruled out depression and anxiety.   Will check CBC, FSH, LH, TSH, Vitamin D and Vitamin B12 to rule out metabolic cause. Discussed that [place] and LH will likely be normal given that she's managed on OCP's.   Recommended she discontinue daily napping and to discontinue OCP's.  Await results.   I evaluated patient, was consulted regarding treatment, and agree with assessment and plan per Tinnie Gens, RN, DNP student.   Allie Bossier, NP-C

## 2022-03-28 NOTE — Progress Notes (Signed)
Subjective:    Patient ID: Mary Mason, female    DOB: 1970-11-09, 51 y.o.   MRN: [id number]  HPI  Mary Mason is a very pleasant 51 y.o. female with a history of fatigue, GAD who presents today to discuss fatigue.  She feels tired "all the time" over the last year. She's also noticed intermittent abdominal bloating, weight gain, intermittent SOB with certain exercises. LMP was 10 years ago as she managed on birth control pills for which she takes continuously. She typically goes to bed at 10 pm, wakes at 4:30 am. She will occasionally take a 40 min to 2 hour nap every day of the week.   Currently managed on Lexapro 10 mg daily for anxiety and overall feels well managed. She denies depression. 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 She has a family history of thyroid disease, last TSH was in November 2021 which was normal.   She denies hot flashes, night sweats, depression. She is considering stopping OCP's in October for a three month duration to see if symptoms improve. She is fearful that menses will resume and that she may feel worse. She would like LH and FSH checked.   Wt Readings from Last 3 Encounters:  03/28/22 143 lb (64.9 kg)  06/20/21 138 lb (62.6 kg)  01/03/21 143 lb (64.9 kg)     Review of Systems  Constitutional:  Positive for fatigue.  Respiratory:  Positive for shortness of breath.   Cardiovascular:  Negative for chest pain and palpitations.  Psychiatric/Behavioral:  Negative for sleep disturbance. The patient is not nervous/anxious.          Past Medical History:  Diagnosis Date   Arthritis    Cellulitis    Chilblain lupus erythematosus    Chilblains    Depression    GAD (generalized anxiety disorder)     Social History   Socioeconomic History   Marital status: Single    Spouse name: Not on file   Number of children: Not on file   Years of education: Not on file   Highest education level: Not on file  Occupational History    Not on file  Tobacco Use   Smoking status: Never   Smokeless tobacco: Never  Substance and Sexual Activity   Alcohol use: Yes    Alcohol/week: 0.0 standard drinks of alcohol    Comment: occasional   Drug use: No   Sexual activity: Yes    Partners: Male    Birth control/protection: Pill  Other Topics Concern   Not on file  Social History Narrative   Divorced   Employed as a Dance movement psychotherapist   No children.   Enjoys exercising, spending time with friends.   Social Determinants of Health   Financial Resource Strain: Not on file  Food Insecurity: Not on file  Transportation Needs: Not on file  Physical Activity: Not on file  Stress: Not on file  Social Connections: Not on file  Intimate Partner Violence: Not on file    Past Surgical History:  Procedure Laterality Date   AUGMENTATION MAMMAPLASTY     BREAST ENHANCEMENT SURGERY  2002   TONSILECTOMY, ADENOIDECTOMY, BILATERAL MYRINGOTOMY AND TUBES      Family History  Problem Relation Age of Onset   Hearing loss Maternal Grandmother    Cancer Maternal Grandmother        Liver cancer   Thyroid nodules Mother    Cancer Maternal Aunt 27  Breast cancer- died at 9   Breast cancer Maternal Aunt    Breast cancer Cousin     No Known Allergies  Current Outpatient Medications on File Prior to Visit  Medication Sig Dispense Refill   B Complex Vitamins (B COMPLEX PO) Take by mouth.     Cholecalciferol (D3 ADULT PO) Take 5,000 mg by mouth daily.     COLLAGEN PO Take by mouth.     escitalopram (LEXAPRO) 10 MG tablet TAKE 1 TABLET(10 MG) BY MOUTH DAILY FOR ANXIETY 90 tablet 3   fluticasone (FLONASE) 50 MCG/ACT nasal spray SHAKE LIQUID AND USE 2 SPRAYS IN EACH NOSTRIL DAILY 16 mL 2   levocetirizine (XYZAL) 5 MG tablet TAKE 1 TABLET BY MOUTH EVERY DAY IN THE EVENING FOR ALLERGIES 90 tablet 0   Levonorgestrel-Ethinyl Estradiol (DAYSEE) 0.15-0.03 &0.01 MG tablet Take 1 tablet by mouth daily. 91 tablet 2   Multiple  Vitamins-Minerals (MULTIVITAMIN WITH MINERALS) tablet Take 1 tablet by mouth daily.     OVER THE COUNTER MEDICATION Celadrin     TURMERIC PO Take by mouth.     mupirocin ointment (BACTROBAN) 2 % Apply to bug bite QD until healed. 22 g 0   No current facility-administered medications on file prior to visit.    BP 110/72   Pulse 66   Temp 98.5 F (36.9 C) (Temporal)   Ht '5\' 5"'$  (1.651 m)   Wt 143 lb (64.9 kg)   LMP  (LMP Unknown)   SpO2 99%   BMI 23.80 kg/m  Objective:   Physical Exam Cardiovascular:     Rate and Rhythm: Normal rate and regular rhythm.  Pulmonary:     Effort: Pulmonary effort is normal.     Breath sounds: Normal breath sounds.  Musculoskeletal:     Cervical back: Neck supple.  Skin:    General: Skin is warm and dry.  Psychiatric:        Mood and Affect: Mood normal.           Assessment & Plan:   Problem List Items Addressed This Visit       Other   Fatigue    Suspect this is multifactorial including daytime napping, hormonal from OCP's. Ruled out depression and anxiety.   Will check CBC, FSH, LH, TSH, Vitamin D and Vitamin B12 to rule out metabolic cause. Discussed that [place] and LH will likely be normal given that she's managed on OCP's.   Recommended she discontinue daily napping and to discontinue OCP's.  Await results.   I evaluated patient, was consulted regarding treatment, and agree with assessment and plan per Tinnie Gens, RN, DNP student.   Allie Bossier, NP-C       Relevant Orders   TSH   Vitamin B12   VITAMIN D 25 Hydroxy (Vit-D Deficiency, Fractures)   Follicle Stimulating Hormone   Luteinizing hormone   CBC   Basic metabolic panel   GAD (generalized anxiety disorder)    Controlled.   Continue Lexapro 10 mg daily.   I evaluated patient, was consulted regarding treatment, and agree with assessment and plan per Tinnie Gens, RN, DNP student.   Allie Bossier, NP-C       Other Visit Diagnoses     Need for influenza  vaccination    -  Primary   Relevant Orders   Flu Vaccine QUAD 5moIM (Fluarix, Fluzone & Alfiuria Quad PF) (Completed)          KPleas Koch NP

## 2022-03-28 NOTE — Progress Notes (Signed)
Established Patient Office Visit  Subjective   Patient ID: Mary Mason, female    DOB: [birth year]  Age: 51 y.o. MRN: [id number]  Chief Complaint  Patient presents with   Annual Exam    Non Fasting    HPI  Mary Mason is a 51 year old female with past medical history of generalized anxiety disorder, fatigue, seasonal allergies presents today for an acute visit.   She is concerned that she is going through pre-menopause. She has been more tired all the time. She has noticed that she multiple episodes of bloating and not necessarily at the same time of the month. She is currently on birth control and would like to stop her birth control for three months to see if she would have a menstrual cycle. No noticeable to changes to her mood. Denies night sweats that wake her up in the middle of the night but does wake up feeling hot in the morning. She has not had a period in ten years. Has noticed that she gets short of breath easily.   Chronic Fatigue: Usually goes to bed around 10 pm and wakes up around 4:30 am during the week. She takes a 45 min-2 hour nap 3-4 times a week. That has been her routine for years. She does not snore. She does not wake up feeling tired.   Abnormal weight gain: Exercises regularly. Eats a healthy diet and keeps a food diary. Concerned as gained 8 lbs within 3-4 months.   Family history of thyroid disease. Unsure hypothyroidism or hyperthyroidism.  Denies any worsening anxiety and depression.    Patient Active Problem List   Diagnosis Date Noted   Environmental and seasonal allergies 05/22/2020   Sinus pressure 04/07/2019   ETD (eustachian tube dysfunction) 03/17/2019   Vertigo 03/17/2019   Varicose veins of both lower extremities with inflammation 01/21/2019   Screening for cervical cancer 01/08/2019   GAD (generalized anxiety disorder) 03/24/2017   Routine general medical examination at a health care facility 10/23/2015   Pain, joint, multiple sites  10/23/2015   Abnormal cervical Papanicolaou smear 06/16/2012   Fatigue 06/11/2011   Chilblain lupus 05/15/2011   Past Medical History:  Diagnosis Date   Arthritis    Cellulitis    Chilblain lupus erythematosus    Chilblains    Depression    GAD (generalized anxiety disorder)    Past Surgical History:  Procedure Laterality Date   AUGMENTATION MAMMAPLASTY     BREAST ENHANCEMENT SURGERY  2002   TONSILECTOMY, ADENOIDECTOMY, BILATERAL MYRINGOTOMY AND TUBES     Social History   Tobacco Use   Smoking status: Never   Smokeless tobacco: Never  Substance Use Topics   Alcohol use: Yes    Alcohol/week: 0.0 standard drinks of alcohol    Comment: occasional   Drug use: No   Social History   Socioeconomic History   Marital status: Single    Spouse name: Not on file   Number of children: Not on file   Years of education: Not on file   Highest education level: Not on file  Occupational History   Not on file  Tobacco Use   Smoking status: Never   Smokeless tobacco: Never  Substance and Sexual Activity   Alcohol use: Yes    Alcohol/week: 0.0 standard drinks of alcohol    Comment: occasional   Drug use: No   Sexual activity: Yes    Partners: Male    Birth control/protection: Pill  Other Topics Concern  Not on file  Social History Narrative   Divorced   Employed as a Dance movement psychotherapist   No children.   Enjoys exercising, spending time with friends.   Social Determinants of Health   Financial Resource Strain: Not on file  Food Insecurity: Not on file  Transportation Needs: Not on file  Physical Activity: Not on file  Stress: Not on file  Social Connections: Not on file  Intimate Partner Violence: Not on file   Family History  Problem Relation Age of Onset   Hearing loss Maternal Grandmother    Cancer Maternal Grandmother        Liver cancer   Thyroid nodules Mother    Cancer Maternal Aunt 37       Breast cancer- died at 59   Breast cancer Maternal Aunt     Breast cancer Cousin    No Known Allergies    Review of Systems  Constitutional:  Positive for malaise/fatigue.  Respiratory:  Negative for shortness of breath.   Cardiovascular:  Negative for chest pain.  Neurological:  Negative for dizziness and headaches.      Objective:     BP 110/72   Pulse 66   Temp 98.5 F (36.9 C) (Temporal)   Ht '5\' 5"'$  (1.651 m)   Wt 143 lb (64.9 kg)   LMP  (LMP Unknown)   SpO2 99%   BMI 23.80 kg/m  BP Readings from Last 3 Encounters:  03/28/22 110/72  06/20/21 130/76  01/03/21 120/74   Wt Readings from Last 3 Encounters:  03/28/22 143 lb (64.9 kg)  06/20/21 138 lb (62.6 kg)  01/03/21 143 lb (64.9 kg)      Physical Exam Vitals reviewed.  Cardiovascular:     Rate and Rhythm: Normal rate and regular rhythm.     Pulses: Normal pulses.     Heart sounds: Normal heart sounds.  Pulmonary:     Effort: Pulmonary effort is normal.     Breath sounds: Normal breath sounds.  Neurological:     Mental Status: She is oriented to person, place, and time.      No results found for any visits on 03/28/22.     The 10-year ASCVD risk score (Arnett DK, et al., 2019) is: 0.5%    Assessment & Plan:   Problem List Items Addressed This Visit       Other   Fatigue    Unclear etiology.   Will check CBC, FSH, LH, TSH, Vitamin D and Vitamin B12.         Relevant Orders   TSH   Vitamin B12   VITAMIN D 25 Hydroxy (Vit-D Deficiency, Fractures)   Follicle Stimulating Hormone   Luteinizing hormone   CBC   Basic metabolic panel   GAD (generalized anxiety disorder)    Controlled.   Continue Lexapro 10 mg daily.        No follow-ups on file.    Tinnie Gens, BSN-RN, DNP STUDENT

## 2022-03-30 DIAGNOSIS — Z1231 Encounter for screening mammogram for malignant neoplasm of breast: Secondary | ICD-10-CM

## 2022-05-12 ENCOUNTER — Other Ambulatory Visit: Payer: Self-pay | Admitting: Primary Care

## 2022-05-12 DIAGNOSIS — Z124 Encounter for screening for malignant neoplasm of cervix: Secondary | ICD-10-CM

## 2022-05-15 ENCOUNTER — Ambulatory Visit: Payer: 59 | Admitting: Dermatology

## 2022-05-15 DIAGNOSIS — L609 Nail disorder, unspecified: Secondary | ICD-10-CM | POA: Diagnosis not present

## 2022-05-15 DIAGNOSIS — K130 Diseases of lips: Secondary | ICD-10-CM

## 2022-05-15 MED ORDER — MUPIROCIN 2 % EX OINT: TOPICAL_OINTMENT | CUTANEOUS | 0 refills | Status: DC

## 2022-05-15 MED ORDER — TACROLIMUS 0.1 % EX OINT: TOPICAL_OINTMENT | CUTANEOUS | 0 refills | Status: DC

## 2022-05-15 MED ORDER — DOXYCYCLINE MONOHYDRATE 100 MG PO CAPS: ORAL_CAPSULE | ORAL | 0 refills | Status: DC

## 2022-05-15 NOTE — Progress Notes (Signed)
   Follow-Up Visit   Subjective  Mary Mason is a 51 y.o. female who presents for the following: Nail problem (On the L 4th finger - injured nail 2 weeks again. Her nail technician removed the nail and now the skin is inflamed and tender) and Cheilitis  (Of the oral commissures - was prescribed a compounded mix from [facility] and is using it, but cracking will not resolve).  The following portions of the chart were reviewed this encounter and updated as appropriate:   Tobacco  Allergies  Meds  Problems  Med Hx  Surg Hx  Fam Hx     Review of Systems:  No other skin or systemic complaints except as noted in HPI or Assessment and Plan.  Objective  Well appearing patient in no apparent distress; mood and affect are within normal limits.  A focused examination was performed including the face and L hand. Relevant physical exam findings are noted in the Assessment and Plan.  Oral commissures Erythema. No evidence of yeast on mouth or on tongue.  L 4th finger Erythema at nail bed.    Assessment & Plan  Cheilitis Oral commissures Angular cheilitis - condition will occur due to creasing,  discussed Hyaluronic Acid filler to improve crease.   Chronic and persistent condition with duration or expected duration over one year. Condition is symptomatic / bothersome to patient. Not to goal.  Start Tacrolimus to aa's QD PRN.  Consider [employer] intertrigo mix in the future if not improving.   tacrolimus (PROTOPIC) 0.1 % ointment - Oral commissures Apply to cracks in the corners of the mouth QD-BID PRN.  Nail problem  Traumatic nail injury with nail loss L 4th finger Doesn't appear to be infected today, but start Mupirocin 2% ointment TID to prevent infection. If area appears to become infected start Doxycycline 100 mg po BID x 7 days. #14 0RF. Doxycycline should be taken with food to prevent nausea. Do not lay down for 30 minutes after taking. Be cautious with sun  exposure and use good sun protection while on this medication. Pregnant women should not take this medication.   Advised patient that nail will likely take 6 + mths to grow back.   mupirocin ointment (BACTROBAN) 2 % - L 4th finger Apply to healing nail TID. doxycycline (MONODOX) 100 MG capsule - L 4th finger Take one cap po BID with food and plenty of drink.  Return for appointment as scheduled.  Luther Redo, CMA, am acting as scribe for Sarina Ser, MD . Documentation: I have reviewed the above documentation for accuracy and completeness, and I agree with the above.  Sarina Ser, MD

## 2022-05-15 NOTE — Patient Instructions (Signed)
Due to recent changes in healthcare laws, you may see results of your pathology and/or laboratory studies on MyChart before the doctors have had a chance to review them. We understand that in some cases there may be results that are confusing or concerning to you. Please understand that not all results are received at the same time and often the doctors may need to interpret multiple results in order to provide you with the best plan of care or course of treatment. Therefore, we ask that you please give us 2 business days to thoroughly review all your results before contacting the office for clarification. Should we see a critical lab result, you will be contacted sooner.   If You Need Anything After Your Visit  If you have any questions or concerns for your doctor, please call our main line at [phone] and press option 4 to reach your doctor's medical assistant. If no one answers, please leave a voicemail as directed and we will return your call as soon as possible. Messages left after 4 pm will be answered the following business day.   You may also send us a message via MyChart. We typically respond to MyChart messages within 1-2 business days.  For prescription refills, please ask your pharmacy to contact our office. Our fax number is [phone].  If you have an urgent issue when the clinic is closed that cannot wait until the next business day, you can page your doctor at the number below.    Please note that while we do our best to be available for urgent issues outside of office hours, we are not available 24/7.   If you have an urgent issue and are unable to reach us, you may choose to seek medical care at your doctor's office, retail clinic, urgent care center, or emergency room.  If you have a medical emergency, please immediately call 911 or go to the emergency department.  Pager Numbers  - Dr. Kowalski: [phone]  - Dr. Moye: [phone]  - Dr. Stewart:  [phone]  In the event of inclement weather, please call our main line at [phone] for an update on the status of any delays or closures.  Dermatology Medication Tips: Please keep the boxes that topical medications come in in order to help keep track of the instructions about where and how to use these. Pharmacies typically print the medication instructions only on the boxes and not directly on the medication tubes.   If your medication is too expensive, please contact our office at [phone] option 4 or send us a message through MyChart.   We are unable to tell what your co-pay for medications will be in advance as this is different depending on your insurance coverage. However, we may be able to find a substitute medication at lower cost or fill out paperwork to get insurance to cover a needed medication.   If a prior authorization is required to get your medication covered by your insurance company, please allow us 1-2 business days to complete this process.  Drug prices often vary depending on where the prescription is filled and some pharmacies may offer cheaper prices.  The website www.goodrx.com contains coupons for medications through different pharmacies. The prices here do not account for what the cost may be with help from insurance (it may be cheaper with your insurance), but the website can give you the price if you did not use any insurance.  - You can print the associated coupon and take it with   your prescription to the pharmacy.  - You may also stop by our office during regular business hours and pick up a GoodRx coupon card.  - If you need your prescription sent electronically to a different pharmacy, notify our office through [facility] MyChart or by phone at [phone] option 4.     Si Usted Necesita Algo Despus de Su Visita  Tambin puede enviarnos un mensaje a travs de MyChart. Por lo general respondemos a los mensajes de MyChart en el transcurso de 1 a 2  das hbiles.  Para renovar recetas, por favor pida a su farmacia que se ponga en contacto con nuestra oficina. Nuestro nmero de fax es el [phone].  Si tiene un asunto urgente cuando la clnica est cerrada y que no puede esperar hasta el siguiente da hbil, puede llamar/localizar a su doctor(a) al nmero que aparece a continuacin.   Por favor, tenga en cuenta que aunque hacemos todo lo posible para estar disponibles para asuntos urgentes fuera del horario de oficina, no estamos disponibles las 24 horas del da, los 7 das de la semana.   Si tiene un problema urgente y no puede comunicarse con nosotros, puede optar por buscar atencin mdica  en el consultorio de su doctor(a), en una clnica privada, en un centro de atencin urgente o en una sala de emergencias.  Si tiene una emergencia mdica, por favor llame inmediatamente al 911 o vaya a la sala de emergencias.  Nmeros de bper  - Dr. Kowalski: [phone]  - Dra. Moye: [phone]  - Dra. Stewart: [phone]  En caso de inclemencias del tiempo, por favor llame a nuestra lnea principal al [phone] para una actualizacin sobre el estado de cualquier retraso o cierre.  Consejos para la medicacin en dermatologa: Por favor, guarde las cajas en las que vienen los medicamentos de uso tpico para ayudarle a seguir las instrucciones sobre dnde y cmo usarlos. Las farmacias generalmente imprimen las instrucciones del medicamento slo en las cajas y no directamente en los tubos del medicamento.   Si su medicamento es muy caro, por favor, pngase en contacto con nuestra oficina llamando al [phone] y presione la opcin 4 o envenos un mensaje a travs de MyChart.   No podemos decirle cul ser su copago por los medicamentos por adelantado ya que esto es diferente dependiendo de la cobertura de su seguro. Sin embargo, es posible que podamos encontrar un medicamento sustituto a menor costo o llenar un formulario para que el  seguro cubra el medicamento que se considera necesario.   Si se requiere una autorizacin previa para que su compaa de seguros cubra su medicamento, por favor permtanos de 1 a 2 das hbiles para completar este proceso.  Los precios de los medicamentos varan con frecuencia dependiendo del lugar de dnde se surte la receta y alguna farmacias pueden ofrecer precios ms baratos.  El sitio web www.goodrx.com tiene cupones para medicamentos de diferentes farmacias. Los precios aqu no tienen en cuenta lo que podra costar con la ayuda del seguro (puede ser ms barato con su seguro), pero el sitio web puede darle el precio si no utiliz ningn seguro.  - Puede imprimir el cupn correspondiente y llevarlo con su receta a la farmacia.  - Tambin puede pasar por nuestra oficina durante el horario de atencin regular y recoger una tarjeta de cupones de GoodRx.  - Si necesita que su receta se enve electrnicamente a una farmacia diferente, informe a nuestra oficina a travs de MyChart de [facility]   o por telfono llamando al [phone] y presione la opcin 4.  

## 2022-05-28 ENCOUNTER — Encounter: Payer: Self-pay | Admitting: Dermatology

## 2022-06-10 ENCOUNTER — Other Ambulatory Visit: Payer: Self-pay | Admitting: Primary Care

## 2022-06-10 DIAGNOSIS — J3089 Other allergic rhinitis: Secondary | ICD-10-CM

## 2022-06-26 ENCOUNTER — Encounter: Payer: 59 | Admitting: Primary Care

## 2022-07-03 ENCOUNTER — Encounter: Payer: 59 | Admitting: Primary Care

## 2022-07-06 ENCOUNTER — Other Ambulatory Visit: Payer: Self-pay | Admitting: Primary Care

## 2022-07-06 DIAGNOSIS — Z124 Encounter for screening for malignant neoplasm of cervix: Secondary | ICD-10-CM

## 2022-07-06 DIAGNOSIS — F411 Generalized anxiety disorder: Secondary | ICD-10-CM

## 2022-07-06 DIAGNOSIS — J3089 Other allergic rhinitis: Secondary | ICD-10-CM

## 2022-07-10 ENCOUNTER — Encounter: Payer: Self-pay | Admitting: Primary Care

## 2022-07-10 ENCOUNTER — Ambulatory Visit (INDEPENDENT_AMBULATORY_CARE_PROVIDER_SITE_OTHER): Payer: No Typology Code available for payment source | Admitting: Primary Care

## 2022-07-10 VITALS — BP 118/76 | HR 64 | Temp 97.3°F | Ht 64.75 in | Wt 138.0 lb

## 2022-07-10 DIAGNOSIS — Z1231 Encounter for screening mammogram for malignant neoplasm of breast: Secondary | ICD-10-CM

## 2022-07-10 DIAGNOSIS — Z1211 Encounter for screening for malignant neoplasm of colon: Secondary | ICD-10-CM

## 2022-07-10 DIAGNOSIS — T691XXS Chilblains, sequela: Secondary | ICD-10-CM

## 2022-07-10 DIAGNOSIS — R5383 Other fatigue: Secondary | ICD-10-CM | POA: Diagnosis not present

## 2022-07-10 DIAGNOSIS — J3089 Other allergic rhinitis: Secondary | ICD-10-CM

## 2022-07-10 DIAGNOSIS — Z0000 Encounter for general adult medical examination without abnormal findings: Secondary | ICD-10-CM | POA: Diagnosis not present

## 2022-07-10 DIAGNOSIS — F411 Generalized anxiety disorder: Secondary | ICD-10-CM

## 2022-07-10 DIAGNOSIS — Z124 Encounter for screening for malignant neoplasm of cervix: Secondary | ICD-10-CM

## 2022-07-10 DIAGNOSIS — M2550 Pain in unspecified joint: Secondary | ICD-10-CM | POA: Diagnosis not present

## 2022-07-10 LAB — COMPREHENSIVE METABOLIC PANEL
ALT: 35 U/L (ref 0–35)
AST: 35 U/L (ref 0–37)
Albumin: 4.5 g/dL (ref 3.5–5.2)
Alkaline Phosphatase: 43 U/L (ref 39–117)
BUN: 20 mg/dL (ref 6–23)
CO2: 31 mEq/L (ref 19–32)
Calcium: 9.9 mg/dL (ref 8.4–10.5)
Chloride: 103 mEq/L (ref 96–112)
Creatinine, Ser: 0.79 mg/dL (ref 0.40–1.20)
GFR: 86.59 mL/min (ref >60.00)
Glucose, Bld: 90 mg/dL (ref 70–99)
Potassium: 4.5 mEq/L (ref 3.5–5.1)
Sodium: 141 mEq/L (ref 135–145)
Total Bilirubin: 0.5 mg/dL (ref 0.2–1.2)
Total Protein: 7 g/dL (ref 6.0–8.3)

## 2022-07-10 LAB — LIPID PANEL
Cholesterol: 192 mg/dL (ref 0–200)
HDL: 95.3 mg/dL (ref >39.00)
LDL Cholesterol: 86 mg/dL (ref 0–99)
NonHDL: 96.69
Total CHOL/HDL Ratio: 2
Triglycerides: 54 mg/dL (ref 0.0–149.0)
VLDL: 10.8 mg/dL (ref 0.0–40.0)

## 2022-07-10 LAB — LUTEINIZING HORMONE: LH: 27.52 m[IU]/mL

## 2022-07-10 LAB — C-REACTIVE PROTEIN: CRP: <1 mg/dL (ref 0.5–20.0)

## 2022-07-10 LAB — URIC ACID: Uric Acid, Serum: 3.4 mg/dL (ref 2.4–7.0)

## 2022-07-10 LAB — SEDIMENTATION RATE: Sed Rate: 5 mm/hr (ref 0–30)

## 2022-07-10 LAB — FOLLICLE STIMULATING HORMONE: FSH: 48.5 m[IU]/mL

## 2022-07-10 NOTE — Progress Notes (Signed)
Subjective:    Patient ID: Mary Mason, female    DOB: Nov 21, 1970, 52 y.o.   MRN: [id number]  HPI  Mary Mason is a very pleasant 52 y.o. female  has a past medical history of Arthritis, Cellulitis, Chilblain lupus erythematosus, Chilblains, Depression, and GAD (generalized anxiety disorder). who presents today for complete physical and follow up of chronic conditions.  She would also like to discuss chronic joint pain. Her joint pain is generalized and located to numerous joints (ankles, knees, hips, neck, shoulders, back) which began about 3 months ago. Also with increased neck pain with left upper extremity numbness. She stopped her birth control and Tumeric in October 2023. She resumed her Tumeric and has noticed some improvement but still aches quite a bit.   She exercises multiple times daily at the gym, has done so for years.   Immunizations: -Tetanus: Completed in 2019 -Influenza: Completed this season -Shingles: Completed Shingrix series  Diet: Fair diet.  Exercise: Regular exercise.  Eye exam: Completes annually  Dental exam: Completes semi-annually   Pap Smear: Completed in July 2020 Mammogram: Completed in April 2022  Colonoscopy: Completed in 2012, overdue,    BP Readings from Last 3 Encounters:  07/10/22 118/76  03/28/22 110/72  06/20/21 130/76      Review of Systems  Constitutional:  Negative for unexpected weight change.  HENT:  Negative for rhinorrhea.   Respiratory:  Negative for cough and shortness of breath.   Cardiovascular:  Negative for chest pain.  Gastrointestinal:  Negative for constipation and diarrhea.  Genitourinary:  Negative for difficulty urinating.  Musculoskeletal:  Positive for arthralgias, back pain and myalgias.  Skin:  Negative for rash.  Allergic/Immunologic: Negative for environmental allergies.  Neurological:  Negative for dizziness and headaches.  Psychiatric/Behavioral:  The patient is not nervous/anxious.           Past Medical History:  Diagnosis Date   Arthritis    Cellulitis    Chilblain lupus erythematosus    Chilblains    Depression    GAD (generalized anxiety disorder)     Social History   Socioeconomic History   Marital status: Single    Spouse name: Not on file   Number of children: Not on file   Years of education: Not on file   Highest education level: Not on file  Occupational History   Not on file  Tobacco Use   Smoking status: Never   Smokeless tobacco: Never  Substance and Sexual Activity   Alcohol use: Yes    Alcohol/week: 0.0 standard drinks of alcohol    Comment: occasional   Drug use: No   Sexual activity: Yes    Partners: Male    Birth control/protection: Pill  Other Topics Concern   Not on file  Social History Narrative   Divorced   Employed as a Dance movement psychotherapist   No children.   Enjoys exercising, spending time with friends.   Social Determinants of Health   Financial Resource Strain: Not on file  Food Insecurity: Not on file  Transportation Needs: Not on file  Physical Activity: Not on file  Stress: Not on file  Social Connections: Not on file  Intimate Partner Violence: Not on file    Past Surgical History:  Procedure Laterality Date   AUGMENTATION MAMMAPLASTY     BREAST ENHANCEMENT SURGERY  2002   TONSILECTOMY, ADENOIDECTOMY, BILATERAL MYRINGOTOMY AND TUBES      Family History  Problem Relation Age of Onset  Hearing loss Maternal Grandmother    Cancer Maternal Grandmother        Liver cancer   Thyroid nodules Mother    Cancer Maternal Aunt 35       Breast cancer- died at 70   Breast cancer Maternal Aunt    Breast cancer Cousin     No Known Allergies  Current Outpatient Medications on File Prior to Visit  Medication Sig Dispense Refill   B Complex Vitamins (B COMPLEX PO) Take by mouth.     Cholecalciferol (D3 ADULT PO) Take 5,000 mg by mouth daily.     COLLAGEN PO Take by mouth.     escitalopram (LEXAPRO) 10 MG  tablet TAKE 1 TABLET BY MOUTH ONCE DAILY FOR ANXIETY 90 tablet 0   fluticasone (FLONASE) 50 MCG/ACT nasal spray SHAKE LIQUID AND USE 2 SPRAYS IN EACH NOSTRIL DAILY 48 mL 0   levocetirizine (XYZAL) 5 MG tablet TAKE 1 TABLET BY MOUTH EVERY DAY IN THE EVENING FOR ALLERGIES 90 tablet 0   Multiple Vitamins-Minerals (MULTIVITAMIN WITH MINERALS) tablet Take 1 tablet by mouth daily.     OVER THE COUNTER MEDICATION Celadrin     tacrolimus (PROTOPIC) 0.1 % ointment Apply to cracks in the corners of the mouth QD-BID PRN. 30 g 0   TURMERIC PO Take by mouth.     doxycycline (MONODOX) 100 MG capsule Take one cap po BID with food and plenty of drink. 14 capsule 0   Levonorgestrel-Ethinyl Estradiol (SIMPESSE) 0.15-0.03 &0.01 MG tablet TAKE 1 TABLET BY MOUTH DAILY (Patient not taking: Reported on 07/10/2022) 91 tablet 0   mupirocin ointment (BACTROBAN) 2 % Apply to healing nail TID. 22 g 0   No current facility-administered medications on file prior to visit.    BP 118/76   Pulse 64   Temp (!) 97.3 F (36.3 C) (Temporal)   Ht 5' 4.75" (1.645 m)   Wt 138 lb (62.6 kg)   LMP  (LMP Unknown)   SpO2 98%   BMI 23.14 kg/m  Objective:   Physical Exam Exam conducted with a chaperone present.  HENT:     Right Ear: Tympanic membrane and ear canal normal.     Left Ear: Tympanic membrane and ear canal normal.     Nose: Nose normal.  Eyes:     Conjunctiva/sclera: Conjunctivae normal.     Pupils: Pupils are equal, round, and reactive to light.  Neck:     Thyroid: No thyromegaly.  Cardiovascular:     Rate and Rhythm: Normal rate and regular rhythm.     Heart sounds: No murmur heard. Pulmonary:     Effort: Pulmonary effort is normal.     Breath sounds: Normal breath sounds. No rales.  Abdominal:     General: Bowel sounds are normal.     Palpations: Abdomen is soft.     Tenderness: There is no abdominal tenderness.  Genitourinary:    Labia:        Right: No tenderness or lesion.        Left: No  tenderness or lesion.      Vagina: No vaginal discharge.     Cervix: No cervical motion tenderness, discharge or erythema.     Uterus: Normal.      Adnexa: Right adnexa normal and left adnexa normal.  Musculoskeletal:        General: Normal range of motion.     Cervical back: Neck supple.  Lymphadenopathy:     Cervical: No cervical adenopathy.  Skin:    General: Skin is warm and dry.     Findings: No rash.  Neurological:     Mental Status: She is alert and oriented to person, place, and time.     Cranial Nerves: No cranial nerve deficit.     Deep Tendon Reflexes: Reflexes are normal and symmetric.  Psychiatric:        Mood and Affect: Mood normal.           Assessment & Plan:  Routine general medical examination at a health care facility Assessment & Plan: Immunizations UTD. Pap smear due, completed today. Colonoscopy overdue, orders placed. Mammogram due, orders placed.  Discussed the importance of a healthy diet and regular exercise in order for weight loss, and to reduce the risk of further co-morbidity.  Exam stable. Labs pending.  Follow up in 1 year for repeat physical.   Orders: -     Lipid panel  Screening for colon cancer -     Ambulatory referral to Gastroenterology  Screening mammogram for breast cancer -     3D Screening Mammogram, Left and Right; Future  Pain, joint, multiple sites Assessment & Plan: Chronic and worsened over the last 3 months.  Reviewed autoimmune labs from 2020 which were negative. Could be secondary to stopping OCP's.  Could be fibromyalgia.   Repeat autoimmune labs again, add ANA this time.  Orders: -     Uric acid -     C-reactive protein -     Sedimentation rate -     Rheumatoid factor -     ANA -     Cyclic citrul peptide antibody, IgG  GAD (generalized anxiety disorder) Assessment & Plan: Controlled.  Continue Lexapro 10 mg daily.   Fatigue, unspecified type Assessment & Plan: Chronic and continued,  mostly with rest. No concerns today.  Orders: -     Comprehensive metabolic panel -     Luteinizing hormone -     Follicle stimulating hormone  Screening for cervical cancer -     Cytology - PAP  Chilblain lupus erythematosus, sequela Assessment & Plan: Controlled.  No flares in years. Continue to monitor.    Environmental and seasonal allergies Assessment & Plan: Controlled.  Continue Xyzal 5 mg and Flonase.          Pleas Koch, NP

## 2022-07-10 NOTE — Assessment & Plan Note (Signed)
Controlled.  Continue Lexapro 10 mg daily. 

## 2022-07-10 NOTE — Assessment & Plan Note (Addendum)
Chronic and worsened over the last 3 months.  Reviewed autoimmune labs from 2020 which were negative. Could be secondary to stopping OCP's.  Could be fibromyalgia.   Repeat autoimmune labs again, add ANA this time.  Consider switching from Lexapro to Cymbalta.

## 2022-07-10 NOTE — Assessment & Plan Note (Signed)
Immunizations UTD. Pap smear due, completed today. Colonoscopy overdue, orders placed. Mammogram due, orders placed.  Discussed the importance of a healthy diet and regular exercise in order for weight loss, and to reduce the risk of further co-morbidity.  Exam stable. Labs pending.  Follow up in 1 year for repeat physical.

## 2022-07-10 NOTE — Assessment & Plan Note (Signed)
Controlled.  No flares in years. Continue to monitor.

## 2022-07-10 NOTE — Assessment & Plan Note (Signed)
Chronic and continued, mostly with rest. No concerns today.

## 2022-07-10 NOTE — Assessment & Plan Note (Signed)
Controlled.  Continue Xyzal 5 mg and Flonase.

## 2022-07-10 NOTE — Patient Instructions (Signed)
Stop by the lab prior to leaving today. I will notify you of your results once received.   [facility] to schedule your mamm[facility].   You will either be contacted via phone regarding your referral to GI for the colonoscopy, or you may receive a letter on your MyChart portal from our referral team with instructions for scheduling an appointment. Please let us know if you have not been contacted by anyone within two weeks.  It was a pleasure to see you today!  Preventive Care 43-95 Years Old, Female Preventive care refers to lifestyle choices and visits with your health care provider that can promote health and wellness. Preventive care visits are also called wellness exams. What can I expect for my preventive care visit? Counseling Your health care provider may ask you questions about your: Medical history, including: Past medical problems. Family medical history. Pregnancy history. Current health, including: Menstrual cycle. Method of birth control. Emotional well-being. Home life and relationship well-being. Sexual activity and sexual health. Lifestyle, including: Alcohol, nicotine or tobacco, and drug use. Access to firearms. Diet, exercise, and sleep habits. Work and work Statistician. Sunscreen use. Safety issues such as seatbelt and bike helmet use. Physical exam Your health care provider will check your: Height and weight. These may be used to calculate your BMI (body mass index). BMI is a measurement that tells if you are at a healthy weight. Waist circumference. This measures the distance around your waistline. This measurement also tells if you are at a healthy weight and may help predict your risk of certain diseases, such as type 2 diabetes and high blood pressure. Heart rate and blood pressure. Body temperature. Skin for abnormal spots. What immunizations do I need?  Vaccines are usually given at various ages, according to a schedule. Your health care  provider will recommend vaccines for you based on your age, medical history, and lifestyle or other factors, such as travel or where you work. What tests do I need? Screening Your health care provider may recommend screening tests for certain conditions. This may include: Lipid and cholesterol levels. Diabetes screening. This is done by checking your blood sugar (glucose) after you have not eaten for a while (fasting). Pelvic exam and Pap test. Hepatitis B test. Hepatitis C test. HIV (human immunodeficiency virus) test. STI (sexually transmitted infection) testing, if you are at risk. Lung cancer screening. Colorectal cancer screening. Mammogram. Talk with your health care provider about when you should start having regular mammograms. This may depend on whether you have a family history of breast cancer. BRCA-related cancer screening. This may be done if you have a family history of breast, ovarian, tubal, or peritoneal cancers. Bone density scan. This is done to screen for osteoporosis. Talk with your health care provider about your test results, treatment options, and if necessary, the need for more tests. Follow these instructions at home: Eating and drinking  Eat a diet that includes fresh fruits and vegetables, whole grains, lean protein, and low-fat dairy products. Take vitamin and mineral supplements as recommended by your health care provider. Do not drink alcohol if: Your health care provider tells you not to drink. You are pregnant, may be pregnant, or are planning to become pregnant. If you drink alcohol: Limit how much you have to 0-1 drink a day. Know how much alcohol is in your drink. In the [place] one drink equals one 12 oz bottle of beer (355 mL), one 5 oz glass of wine (148 mL), or one  1 oz glass of hard liquor (44 mL). Lifestyle Brush your teeth every morning and night with fluoride toothpaste. Floss one time each day. Exercise for at least 30 minutes 5 or more days  each week. Do not use any products that contain nicotine or tobacco. These products include cigarettes, chewing tobacco, and vaping devices, such as e-cigarettes. If you need help quitting, ask your health care provider. Do not use drugs. If you are sexually active, practice safe sex. Use a condom or other form of protection to prevent STIs. If you do not wish to become pregnant, use a form of birth control. If you plan to become pregnant, see your health care provider for a prepregnancy visit. Take aspirin only as told by your health care provider. Make sure that you understand how much to take and what form to take. Work with your health care provider to find out whether it is safe and beneficial for you to take aspirin daily. Find healthy ways to manage stress, such as: Meditation, yoga, or listening to music. Journaling. Talking to a trusted person. Spending time with friends and family. Minimize exposure to UV radiation to reduce your risk of skin cancer. Safety Always wear your seat belt while driving or riding in a vehicle. Do not drive: If you have been drinking alcohol. Do not ride with someone who has been drinking. When you are tired or distracted. While texting. If you have been using any mind-altering substances or drugs. Wear a helmet and other protective equipment during sports activities. If you have firearms in your house, make sure you follow all gun safety procedures. Seek help if you have been physically or sexually abused. What's next? Visit your health care provider once a year for an annual wellness visit. Ask your health care provider how often you should have your eyes and teeth checked. Stay up to date on all vaccines. This information is not intended to replace advice given to you by your health care provider. Make sure you discuss any questions you have with your health care provider. Document Revised: 12/13/2020 Document Reviewed: 12/13/2020 Elsevier Patient  Education  [place].

## 2022-07-16 ENCOUNTER — Ambulatory Visit
Admission: RE | Admit: 2022-07-16 | Discharge: 2022-07-16 | Disposition: A | Discharge status: Home / Self Care | Payer: No Typology Code available for payment source | Source: Ambulatory Visit | Attending: Sports Medicine | Admitting: Sports Medicine

## 2022-07-16 ENCOUNTER — Ambulatory Visit (INDEPENDENT_AMBULATORY_CARE_PROVIDER_SITE_OTHER): Payer: No Typology Code available for payment source | Admitting: Sports Medicine

## 2022-07-16 VITALS — BP 124/64 | Ht 65.0 in | Wt 138.0 lb

## 2022-07-16 DIAGNOSIS — M542 Cervicalgia: Secondary | ICD-10-CM

## 2022-07-16 DIAGNOSIS — M25551 Pain in right hip: Secondary | ICD-10-CM

## 2022-07-16 DIAGNOSIS — M549 Dorsalgia, unspecified: Secondary | ICD-10-CM

## 2022-07-16 LAB — ESTROGENS, TOTAL: Estrogen: 56 pg/mL

## 2022-07-16 LAB — ANA: Anti Nuclear Antibody (ANA): NEGATIVE

## 2022-07-16 LAB — RHEUMATOID FACTOR: Rheumatoid fact SerPl-aCnc: <14 IU/mL (ref <14)

## 2022-07-16 LAB — CYCLIC CITRUL PEPTIDE ANTIBODY, IGG: Cyclic Citrullin Peptide Ab: <16 UNITS

## 2022-07-16 MED ORDER — MELOXICAM 15 MG PO TABS: ORAL_TABLET | ORAL | 2 refills | Status: DC

## 2022-07-16 NOTE — Progress Notes (Signed)
Subjective:   HPI: Patient is a 52 y.o. female here for neck, lower back, and right hip pain.  Patient reports that for the last month she has had pain in her neck and lower back that is new. She has a history of right hip pain and had a previous MRI and was diagnosed with arthritis in that hip, but feels it has been getting a bit worse.  For her neck pain that came on suddenly and she woke up one night with an almost painful numbness feeling going down her entire arm into her first 3 fingers. She had improvement after shaking out the arm for about 10-15 minutes. This pain is now occurring about 5 nights per week and sometimes will occur during the day as well (though is most consistent at night) and it self-resolves with time. She sometimes has similar symptoms going down the right arm as well but less frequently. She is not currently taking any medications for it specifically and has been able to complete her work-outs still with the symptoms.  For her lower back pain, she feels like it is a point tenderness in her lower back on her spine. She has not had any limitations in her exercises and still remains very active. She does not have any radiating symptoms from the lower back. She went to the chiropractor a few times but did not have any improvement.  Patient has not had any formal physical therapy and lives in [place].  PMH, medications, surgical history, social history, family history and allergies reviewed.     Objective:  Physical Exam: BP 124/64   Ht '5\' 5"'$  (1.651 m)   Wt 138 lb (62.6 kg)   LMP  (LMP Unknown)   BMI 22.96 kg/m  Gen: awake, alert, NAD, comfortable in exam room Pulm: breathing unlabored  Neck/Back: - Inspection: no gross deformity or asymmetry, swelling or ecchymosis - Palpation: TTP over the spinous process around C7/T1  - ROM: full active ROM of the cervical spine with neck extension, rotation, flexion - Strength: 5/5 wrist flexion, extension, biceps flexion.  5/5 triceps extension. OK sign, interosseus strength intact  - Neuro: sensation intact in the C5-C8 nerve root distribution b/l, 3+ cervical reflexes except for the left brachioradialis which was 2+ - Special testing: negative slump test, negative spurling's  Lumbar spine: - Inspection: no gross deformity or asymmetry, swelling or ecchymosis - Palpation: TTP over the spinous near the thoracolumbar junction. Non-tender paraspinal muscles, or SI joints b/l - ROM: full active ROM of the lumbar spine in flexion and extension without pain - Strength: 5/5 strength of lower extremity in L4-S1 nerve root distributions b/l; normal gait - Neuro: sensation intact in the L4-S1 nerve root distribution b/l - Special testing: Negative straight leg raise, negative slump, negative Stork test, Negative FABER  Right Hip:  - Inspection: No gross deformity, no swelling, erythema, or ecchymosis - Palpation: No TTP, specifically none over greater trochanter - ROM: Decreased ROM with IR (20 degrees compared to 30 degrees with log roll on right leg) - Strength: Normal strength. - Neuro/vasc: NV intact distally - Special Tests: Some restriction and pain with FADIR. Negative FABER.     Assessment & Plan:  1. Cervical spine tenderness Given the radicular symptoms, concern for possible spinal stenosis and will obtain x-rays. Discussed with patient that initial treatment for most of the conditions in the spine will be physical therapy (patient lives in [place]). - 2 View cervical spine - Pending results, will consider  PT - Meloxicam '15mg'$  daily PRN  2. Lumbar Spinal tenderness Point tenderness near the thoracolumbar junction that did not improve after manipulation with chiropractor. Given the point tenderness on the spinal process, will obtain x-rays and have the radiology tech place bb where the point tenderness is to isolate the region. - 2 view lumbar spine - Continue exercises as tolerated   3. Right hip  arthritis Known arthritis of the right hip on prior MRI which appears to be worsening for the patient. Will obtain x-rays to see if any significant changes noted that would warrant further evaluation for intervention. At this time, will recommend NSAID as patient is not currently using any medications.  - 2 view Right Hip - Meloxicam '15mg'$  daily PRN  Patient seen and evaluated with the resident.  I agree with the above plan of care.  I was able to review x-rays of the cervical spine, lumbar spine, and right hip.  Right hip arthritis has not advanced when compared to previous films.  She does have some mild lumbar degenerative disc disease as well.  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  She has a diminished brachial radialis reflex on the left as well.  I would like to proceed with an MRI of the cervical spine to rule out cervical spinal stenosis.  I will follow-up with her with those results when available.  In the meantime, we will try some meloxicam 15 mg daily as needed.  This note was dictated using Dragon naturally speaking software and may contain errors in syntax, spelling, or content which have not been identified prior to signing this note.

## 2022-07-17 ENCOUNTER — Other Ambulatory Visit: Payer: Self-pay | Admitting: *Deleted

## 2022-07-17 ENCOUNTER — Encounter: Payer: Self-pay | Admitting: Sports Medicine

## 2022-07-17 ENCOUNTER — Telehealth: Payer: Self-pay | Admitting: *Deleted

## 2022-07-17 ENCOUNTER — Other Ambulatory Visit: Payer: Self-pay

## 2022-07-17 DIAGNOSIS — M542 Cervicalgia: Secondary | ICD-10-CM

## 2022-07-17 DIAGNOSIS — Z1211 Encounter for screening for malignant neoplasm of colon: Secondary | ICD-10-CM

## 2022-07-17 MED ORDER — NA SULFATE-K SULFATE-MG SULF 17.5-3.13-1.6 GM/177ML PO SOLN: 1.0000 | Freq: Once | ORAL | 0 refills | Status: AC

## 2022-07-17 MED ORDER — SUTAB 1479-225-188 MG PO TABS: ORAL_TABLET | ORAL | 0 refills | Status: DC

## 2022-07-17 NOTE — Telephone Encounter (Deleted)
Patient was last schedule for colonoscopy with Dr Vicente Males back in 07/19/2019 but that was cancel.  I have schedule patient with Dr Marius Ditch due to her schedule.

## 2022-07-17 NOTE — Telephone Encounter (Signed)
Gastroenterology Pre-Procedure Review  Request Date: 08/16/2022 Requesting Physician: Dr. Marius Ditch  PATIENT REVIEW QUESTIONS: The patient responded to the following health history questions as indicated:    1. Are you having any GI issues? no 2. Do you have a personal history of Polyps? no 3. Do you have a family history of Colon Cancer or Polyps? no 4. Diabetes Mellitus? no 5. Joint replacements in the past 12 months?no 6. Major health problems in the past 3 months?no 7. Any artificial heart valves, MVP, or defibrillator?no    MEDICATIONS & ALLERGIES:    Patient reports the following regarding taking any anticoagulation/antiplatelet therapy:   Plavix, Coumadin, Eliquis, Xarelto, Lovenox, Pradaxa, Brilinta, or Effient? no Aspirin? no  Patient confirms/reports the following medications:  Current Outpatient Medications  Medication Sig Dispense Refill   Na Sulfate-K Sulfate-Mg Sulf 17.5-3.13-1.6 GM/177ML SOLN Take 1 kit by mouth once for 1 dose. 354 mL 0   Sodium Sulfate-Mag Sulfate-KCl (SUTAB) [phone] MG TABS Take 1 kit by mouth once as instructed 24 tablet 0   B Complex Vitamins (B COMPLEX PO) Take by mouth.     Cholecalciferol (D3 ADULT PO) Take 5,000 mg by mouth daily.     COLLAGEN PO Take by mouth.     escitalopram (LEXAPRO) 10 MG tablet TAKE 1 TABLET BY MOUTH ONCE DAILY FOR ANXIETY 90 tablet 0   fluticasone (FLONASE) 50 MCG/ACT nasal spray SHAKE LIQUID AND USE 2 SPRAYS IN EACH NOSTRIL DAILY 48 mL 0   levocetirizine (XYZAL) 5 MG tablet TAKE 1 TABLET BY MOUTH EVERY DAY IN THE EVENING FOR ALLERGIES 90 tablet 0   Levonorgestrel-Ethinyl Estradiol (SIMPESSE) 0.15-0.03 &0.01 MG tablet TAKE 1 TABLET BY MOUTH DAILY (Patient not taking: Reported on 07/10/2022) 91 tablet 0   meloxicam (MOBIC) 15 MG tablet Take one pill a day with food for 7 days and then prn thereafter 40 tablet 2   Multiple Vitamins-Minerals (MULTIVITAMIN WITH MINERALS) tablet Take 1 tablet by mouth daily.     OVER THE  COUNTER MEDICATION Celadrin     tacrolimus (PROTOPIC) 0.1 % ointment Apply to cracks in the corners of the mouth QD-BID PRN. 30 g 0   TURMERIC PO Take by mouth.     No current facility-administered medications for this visit.    Patient confirms/reports the following allergies:  No Known Allergies  No orders of the defined types were placed in this encounter.   AUTHORIZATION INFORMATION Primary Insurance: 1D#: Group #:  Secondary Insurance: 1D#: Group #:  SCHEDULE INFORMATION: Date: 08/16/2022 Time: Location: [facility]

## 2022-07-17 NOTE — Telephone Encounter (Signed)
Patient was last schedule for colonoscopy with Dr Vicente Males back in 07/19/2019 but that was cancel.  I have schedule patient with Dr Marius Ditch due to her schedule.

## 2022-07-18 ENCOUNTER — Other Ambulatory Visit: Payer: Self-pay | Admitting: Primary Care

## 2022-07-18 DIAGNOSIS — R5383 Other fatigue: Secondary | ICD-10-CM

## 2022-07-18 MED ORDER — LEVONORGEST-ETH ESTRAD 91-DAY 0.1-0.02 & 0.01 MG PO TABS: 1.0000 | ORAL_TABLET | Freq: Every day | ORAL | 3 refills | Status: DC

## 2022-07-19 LAB — CYTOLOGY - PAP
Adequacy: ABNORMAL
Comment: NEGATIVE

## 2022-07-26 ENCOUNTER — Other Ambulatory Visit: Payer: No Typology Code available for payment source

## 2022-07-30 ENCOUNTER — Telehealth: Payer: Self-pay | Admitting: *Deleted

## 2022-07-30 NOTE — Telephone Encounter (Signed)
Patient left a voicemail stating that she would like to reschedule her colonoscopy to a Monday instead of the current date of Friday, 08/16/2022.  We have reschedule the colonoscopy to 08/19/2022.  Patient verbalized understanding.  New instructions will be sent.

## 2022-07-31 ENCOUNTER — Other Ambulatory Visit (HOSPITAL_COMMUNITY)
Admission: RE | Admit: 2022-07-31 | Discharge: 2022-07-31 | Disposition: A | Discharge status: Home / Self Care | Payer: No Typology Code available for payment source | Source: Ambulatory Visit | Attending: Primary Care | Admitting: Primary Care

## 2022-07-31 ENCOUNTER — Encounter: Payer: Self-pay | Admitting: Sports Medicine

## 2022-07-31 ENCOUNTER — Ambulatory Visit (INDEPENDENT_AMBULATORY_CARE_PROVIDER_SITE_OTHER): Payer: No Typology Code available for payment source | Admitting: Primary Care

## 2022-07-31 ENCOUNTER — Encounter: Payer: Self-pay | Admitting: Primary Care

## 2022-07-31 VITALS — BP 110/64 | HR 60 | Temp 98.1°F | Ht 65.0 in | Wt 141.0 lb

## 2022-07-31 DIAGNOSIS — M2550 Pain in unspecified joint: Secondary | ICD-10-CM

## 2022-07-31 DIAGNOSIS — J0100 Acute maxillary sinusitis, unspecified: Secondary | ICD-10-CM

## 2022-07-31 DIAGNOSIS — Z124 Encounter for screening for malignant neoplasm of cervix: Secondary | ICD-10-CM | POA: Insufficient documentation

## 2022-07-31 HISTORY — DX: Acute maxillary sinusitis, unspecified: J01.00

## 2022-07-31 MED ORDER — AMOXICILLIN-POT CLAVULANATE 875-125 MG PO TABS: 1.0000 | ORAL_TABLET | Freq: Two times a day (BID) | ORAL | 0 refills | Status: DC

## 2022-07-31 NOTE — Assessment & Plan Note (Signed)
Given duration of symptoms, coupled with increased/new symptoms, will treat for presumed infection.  Start Augmentin antibiotics,1 tablet by mouth twice daily for 7 days.  Continue Flonase. Follow up PRN

## 2022-07-31 NOTE — Assessment & Plan Note (Signed)
Repeat pap smear today completed given non diagnostic sample from last visit. Tolerated well. Exam today benign.

## 2022-07-31 NOTE — Assessment & Plan Note (Signed)
Improved.  Continue Tumeric and OCP's

## 2022-07-31 NOTE — Patient Instructions (Signed)
Start Augmentin antibiotics for the infection Take 1 tablet by mouth twice daily for 7 days.  I will be in touch again soon regarding your pap smear results.  It was a pleasure to see you today!

## 2022-07-31 NOTE — Progress Notes (Signed)
Subjective:    Patient ID: Mary Mason, female    DOB: Feb 20, 1971, 52 y.o.   MRN: [id number]  Gynecologic Exam The patient's pertinent negatives include no vaginal discharge. Pertinent negatives include no sore throat.    Mary Mason is a very pleasant 52 y.o. female who presents today for pap smear only. She would also like to discuss sinus pressure.  1) Pap Smear: She was last evaluated in January 2024 for her routine physical. Pap smear was completed at the time which resulted unsatisfactory for evaluation and non diagnostic. She is here today for repeat pap smear.   2) Sinus Pressure: Symptom onset 4-5 weeks ago with left ear fullness and pressure. She increased her Flonase use to twice daily with very little improvement. Yesterday she began to notice left maxillary sinus pressure and a sensation of air coming out of the left eye when blowing her nose. She doesn't feel sick but doesn't feel her typical self.   She does have a history of sinusitis years ago.   Review of Systems  HENT:  Positive for sinus pressure and sinus pain. Negative for sore throat.        Ear fullness  Respiratory:  Negative for cough.   Genitourinary:  Negative for menstrual problem and vaginal discharge.         Past Medical History:  Diagnosis Date   Arthritis    Cellulitis    Chilblain lupus erythematosus    Chilblains    Depression    GAD (generalized anxiety disorder)     Social History   Socioeconomic History   Marital status: Single    Spouse name: Not on file   Number of children: Not on file   Years of education: Not on file   Highest education level: Not on file  Occupational History   Not on file  Tobacco Use   Smoking status: Never   Smokeless tobacco: Never  Substance and Sexual Activity   Alcohol use: Yes    Alcohol/week: 0.0 standard drinks of alcohol    Comment: occasional   Drug use: No   Sexual activity: Yes    Partners: Male    Birth control/protection: Pill   Other Topics Concern   Not on file  Social History Narrative   Divorced   Employed as a Dance movement psychotherapist   No children.   Enjoys exercising, spending time with friends.   Social Determinants of Health   Financial Resource Strain: Not on file  Food Insecurity: Not on file  Transportation Needs: Not on file  Physical Activity: Not on file  Stress: Not on file  Social Connections: Not on file  Intimate Partner Violence: Not on file    Past Surgical History:  Procedure Laterality Date   AUGMENTATION MAMMAPLASTY     BREAST ENHANCEMENT SURGERY  2002   TONSILECTOMY, ADENOIDECTOMY, BILATERAL MYRINGOTOMY AND TUBES      Family History  Problem Relation Age of Onset   Hearing loss Maternal Grandmother    Cancer Maternal Grandmother        Liver cancer   Thyroid nodules Mother    Cancer Maternal Aunt 82       Breast cancer- died at 54   Breast cancer Maternal Aunt    Breast cancer Cousin     No Known Allergies  Current Outpatient Medications on File Prior to Visit  Medication Sig Dispense Refill   B Complex Vitamins (B COMPLEX PO) Take by mouth.  Cholecalciferol (D3 ADULT PO) Take 5,000 mg by mouth daily.     COLLAGEN PO Take by mouth.     escitalopram (LEXAPRO) 10 MG tablet TAKE 1 TABLET BY MOUTH ONCE DAILY FOR ANXIETY 90 tablet 0   fluticasone (FLONASE) 50 MCG/ACT nasal spray SHAKE LIQUID AND USE 2 SPRAYS IN EACH NOSTRIL DAILY 48 mL 0   levocetirizine (XYZAL) 5 MG tablet TAKE 1 TABLET BY MOUTH EVERY DAY IN THE EVENING FOR ALLERGIES 90 tablet 0   Levonorgestrel-Ethinyl Estradiol (CAMRESE LO) 0.1-0.02 & 0.01 MG tablet Take 1 tablet by mouth daily. 91 tablet 3   meloxicam (MOBIC) 15 MG tablet Take one pill a day with food for 7 days and then prn thereafter 40 tablet 2   Multiple Vitamins-Minerals (MULTIVITAMIN WITH MINERALS) tablet Take 1 tablet by mouth daily.     OVER THE COUNTER MEDICATION Celadrin     Sodium Sulfate-Mag Sulfate-KCl (SUTAB) [phone] MG TABS  Take 1 kit by mouth once as instructed 24 tablet 0   tacrolimus (PROTOPIC) 0.1 % ointment Apply to cracks in the corners of the mouth QD-BID PRN. 30 g 0   TURMERIC PO Take by mouth.     No current facility-administered medications on file prior to visit.    BP 110/64   Pulse 60   Temp 98.1 F (36.7 C) (Temporal)   Ht '5\' 5"'$  (1.651 m)   Wt 141 lb (64 kg)   LMP  (LMP Unknown)   SpO2 98%   BMI 23.46 kg/m  Objective:   Physical Exam Constitutional:      Appearance: She is not ill-appearing.  HENT:     Right Ear: Tympanic membrane and ear canal normal.     Left Ear: Tympanic membrane and ear canal normal.  Cardiovascular:     Rate and Rhythm: Normal rate.  Pulmonary:     Effort: Pulmonary effort is normal.  Genitourinary:    Labia:        Right: No tenderness or lesion.        Left: No tenderness or lesion.      Vagina: Normal.     Cervix: No cervical motion tenderness, discharge, erythema or cervical bleeding.     Uterus: Normal.            Assessment & Plan:  Acute non-recurrent maxillary sinusitis Assessment & Plan: Given duration of symptoms, coupled with increased/new symptoms, will treat for presumed infection.  Start Augmentin antibiotics,1 tablet by mouth twice daily for 7 days.  Continue Flonase. Follow up PRN  Orders: -     Amoxicillin-Pot Clavulanate; Take 1 tablet by mouth 2 (two) times daily.  Dispense: 14 tablet; Refill: 0  Screening for cervical cancer Assessment & Plan: Repeat pap smear today completed given non diagnostic sample from last visit. Tolerated well. Exam today benign.   Orders: -     Cytology - PAP  Pain, joint, multiple sites Assessment & Plan: Improved.  Continue Tumeric and OCP's         Pleas Koch, NP

## 2022-07-31 NOTE — Addendum Note (Signed)
Addended by: Pleas Koch on: 07/31/2022 05:27 PM   Modules accepted: Level of Service

## 2022-08-01 ENCOUNTER — Ambulatory Visit
Admission: RE | Admit: 2022-08-01 | Discharge: 2022-08-01 | Disposition: A | Discharge status: Home / Self Care | Payer: No Typology Code available for payment source | Source: Ambulatory Visit | Attending: Primary Care | Admitting: Primary Care

## 2022-08-01 DIAGNOSIS — Z1231 Encounter for screening mammogram for malignant neoplasm of breast: Secondary | ICD-10-CM | POA: Insufficient documentation

## 2022-08-04 ENCOUNTER — Other Ambulatory Visit: Payer: Self-pay | Admitting: Primary Care

## 2022-08-04 DIAGNOSIS — J3089 Other allergic rhinitis: Secondary | ICD-10-CM

## 2022-08-05 LAB — CYTOLOGY - PAP
Adequacy: ABSENT
Diagnosis: NEGATIVE

## 2022-08-19 ENCOUNTER — Encounter: Payer: Self-pay | Admitting: Gastroenterology

## 2022-08-19 ENCOUNTER — Ambulatory Visit
Admission: RE | Admit: 2022-08-19 | Discharge: 2022-08-19 | Disposition: A | Discharge status: Home / Self Care | Payer: No Typology Code available for payment source | Source: Ambulatory Visit | Attending: Gastroenterology | Admitting: Gastroenterology

## 2022-08-19 ENCOUNTER — Ambulatory Visit: Payer: No Typology Code available for payment source | Admitting: Anesthesiology

## 2022-08-19 ENCOUNTER — Encounter
Admission: RE | Disposition: A | Discharge status: Home / Self Care | Payer: Self-pay | Source: Ambulatory Visit | Attending: Gastroenterology

## 2022-08-19 DIAGNOSIS — F32A Depression, unspecified: Secondary | ICD-10-CM | POA: Diagnosis not present

## 2022-08-19 DIAGNOSIS — F419 Anxiety disorder, unspecified: Secondary | ICD-10-CM | POA: Diagnosis not present

## 2022-08-19 DIAGNOSIS — M1990 Unspecified osteoarthritis, unspecified site: Secondary | ICD-10-CM | POA: Diagnosis not present

## 2022-08-19 DIAGNOSIS — Z1211 Encounter for screening for malignant neoplasm of colon: Secondary | ICD-10-CM | POA: Insufficient documentation

## 2022-08-19 HISTORY — PX: COLONOSCOPY WITH PROPOFOL: SHX5780

## 2022-08-19 SURGERY — COLONOSCOPY WITH PROPOFOL
Anesthesia: General

## 2022-08-19 MED ORDER — LIDOCAINE HCL (PF) 2 % IJ SOLN
INTRAMUSCULAR | Status: AC
  Filled 2022-08-19: qty 5

## 2022-08-19 MED ORDER — PROPOFOL 10 MG/ML IV BOLUS
INTRAVENOUS | Status: DC | PRN
  Administered 2022-08-19: 70 mg via INTRAVENOUS
  Administered 2022-08-19: 30 mg via INTRAVENOUS

## 2022-08-19 MED ORDER — PROPOFOL 500 MG/50ML IV EMUL
INTRAVENOUS | Status: DC | PRN
  Administered 2022-08-19: 150 ug/kg/min via INTRAVENOUS

## 2022-08-19 MED ORDER — SODIUM CHLORIDE 0.9 % IV SOLN: INTRAVENOUS | Status: DC

## 2022-08-19 MED ORDER — LIDOCAINE 2% (20 MG/ML) 5 ML SYRINGE
INTRAMUSCULAR | Status: DC | PRN
  Administered 2022-08-19: 50 mg via INTRAVENOUS

## 2022-08-19 MED ORDER — D3 ADULT 25 MCG (1000 UT) PO CHEW: 1.0000 | CHEWABLE_TABLET | Freq: Every day | ORAL | Status: AC

## 2022-08-19 MED ORDER — SIMETHICONE 40 MG/0.6ML PO SUSP
ORAL | Status: DC | PRN
  Administered 2022-08-19: 60 mL

## 2022-08-19 NOTE — H&P (Signed)
Cephas Darby, MD [address]  [place]  [place], [place] [address]  Main: [phone]  Fax: [phone] Pager: [phone]  Primary Care Physician:  Pleas Koch, NP Primary Gastroenterologist:  Dr. Cephas Darby  Pre-Procedure History & Physical: HPI:  Mary Mason is a 52 y.o. female is here for an colonoscopy.   Past Medical History:  Diagnosis Date   Arthritis    Cellulitis    Chilblain lupus erythematosus    Chilblains    Depression    GAD (generalized anxiety disorder)     Past Surgical History:  Procedure Laterality Date   AUGMENTATION MAMMAPLASTY     BREAST ENHANCEMENT SURGERY  2002   TONSILECTOMY, ADENOIDECTOMY, BILATERAL MYRINGOTOMY AND TUBES      Prior to Admission medications   Medication Sig Start Date End Date Taking? Authorizing Provider  escitalopram (LEXAPRO) 10 MG tablet TAKE 1 TABLET BY MOUTH ONCE DAILY FOR ANXIETY 07/07/22  Yes Pleas Koch, NP  Levonorgestrel-Ethinyl Estradiol (CAMRESE LO) 0.1-0.02 & 0.01 MG tablet Take 1 tablet by mouth daily. 07/18/22  Yes Pleas Koch, NP  amoxicillin-clavulanate (AUGMENTIN) 875-125 MG tablet Take 1 tablet by mouth 2 (two) times daily. 07/31/22   Pleas Koch, NP  B Complex Vitamins (B COMPLEX PO) Take by mouth.    [provider]  Cholecalciferol (D3 ADULT PO) Take 5,000 mg by mouth daily.    [provider]  COLLAGEN PO Take by mouth.    [provider]  fluticasone (FLONASE) 50 MCG/ACT nasal spray SHAKE LIQUID AND USE 2 SPRAYS IN EACH NOSTRIL DAILY 08/05/22   Pleas Koch, NP  levocetirizine (XYZAL) 5 MG tablet TAKE 1 TABLET BY MOUTH EVERY DAY IN THE EVENING FOR ALLERGIES 07/07/22   Pleas Koch, NP  meloxicam (MOBIC) 15 MG tablet Take one pill a day with food for 7 days and then prn thereafter 07/16/22   Thurman Coyer, DO  Multiple Vitamins-Minerals (MULTIVITAMIN WITH MINERALS) tablet Take 1 tablet by mouth daily.    [provider]   OVER THE COUNTER MEDICATION Celadrin    [provider]  Sodium Sulfate-Mag Sulfate-KCl (SUTAB) [phone] MG TABS Take 1 kit by mouth once as instructed 07/17/22   Lin Landsman, MD  tacrolimus (PROTOPIC) 0.1 % ointment Apply to cracks in the corners of the mouth QD-BID PRN. 05/15/22   Ralene Bathe, MD  TURMERIC PO Take by mouth.    [provider]    Allergies as of 07/17/2022   (No Known Allergies)    Family History  Problem Relation Age of Onset   Hearing loss Maternal Grandmother    Cancer Maternal Grandmother        Liver cancer   Thyroid nodules Mother    Cancer Maternal Aunt 69       Breast cancer- died at 86   Breast cancer Maternal Aunt    Breast cancer Cousin     Social History   Socioeconomic History   Marital status: Single    Spouse name: Not on file   Number of children: Not on file   Years of education: Not on file   Highest education level: Not on file  Occupational History   Not on file  Tobacco Use   Smoking status: Never   Smokeless tobacco: Never  Vaping Use   Vaping Use: Never used  Substance and Sexual Activity   Alcohol use: Yes    Alcohol/week: 0.0 standard drinks of  alcohol    Comment: occasional   Drug use: No   Sexual activity: Yes    Partners: Male    Birth control/protection: Pill  Other Topics Concern   Not on file  Social History Narrative   Divorced   Employed as a Dance movement psychotherapist   No children.   Enjoys exercising, spending time with friends.   Social Determinants of Health   Financial Resource Strain: Not on file  Food Insecurity: Not on file  Transportation Needs: Not on file  Physical Activity: Not on file  Stress: Not on file  Social Connections: Not on file  Intimate Partner Violence: Not on file    Review of Systems: See HPI, otherwise negative ROS  Physical Exam: BP 122/80   Pulse 67   Temp (!) 97.5 F (36.4 C) (Temporal)   Resp 16   Ht 5' 5"$  (1.651 m)   Wt 61.7  kg   SpO2 100%   BMI 22.63 kg/m  General:   Alert,  pleasant and cooperative in NAD Head:  Normocephalic and atraumatic. Neck:  Supple; no masses or thyromegaly. Lungs:  Clear throughout to auscultation.    Heart:  Regular rate and rhythm. Abdomen:  Soft, nontender and nondistended. Normal bowel sounds, without guarding, and without rebound.   Neurologic:  Alert and  oriented x4;  grossly normal neurologically.  Impression/Plan: Mary Mason is here for an colonoscopy to be performed for colon cancer screening  Risks, benefits, limitations, and alternatives regarding  colonoscopy have been reviewed with the patient.  Questions have been answered.  All parties agreeable.   Sherri Sear, MD  08/19/2022, 9:58 AM

## 2022-08-19 NOTE — Transfer of Care (Signed)
Immediate Anesthesia Transfer of Care Note  Patient: Mary Mason  Procedure(s) Performed: COLONOSCOPY WITH PROPOFOL  Patient Location: Endoscopy Unit  Anesthesia Type:General  Level of Consciousness: awake, alert , and oriented  Airway & Oxygen Therapy: Patient Spontanous Breathing  Post-op Assessment: Report given to RN and Post -op Vital signs reviewed and stable  Post vital signs: Reviewed and stable  Last Vitals:  Vitals Value Taken Time  BP 85/56 08/19/22 1034  Temp    Pulse 68 08/19/22 1035  Resp 19 08/19/22 1035  SpO2 100 % 08/19/22 1035  Vitals shown include unvalidated device data.  Last Pain:  Vitals:   08/19/22 1033  TempSrc: Temporal  PainSc: 0-No pain         Complications: No notable events documented.

## 2022-08-19 NOTE — Op Note (Signed)
[facility] Gastroenterology Patient Name: Mary Mason Procedure Date: 08/19/2022 10:04 AM MRN: [id number] Account #: [id number] Date of Birth: April 26, 1971 Admit Type: Outpatient Age: 52 Room: [facility] ENDO ROOM 4 Gender: Female Note Status: Finalized Instrument Name: Colonoscope [id number] Procedure:             Colonoscopy Indications:           Screening for colorectal malignant neoplasm, Last                         colonoscopy: September 2011, Last colonoscopy 10 years                         ago Providers:             Lin Landsman MD, MD Referring MD:          Pleas Koch (Referring MD) Medicines:             General Anesthesia Complications:         No immediate complications. Procedure:             Pre-Anesthesia Assessment:                        - Prior to the procedure, a History and Physical was                         performed, and patient medications and allergies were                         reviewed. The patient is competent. The risks and                         benefits of the procedure and the sedation options and                         risks were discussed with the patient. All questions                         were answered and informed consent was obtained.                         Patient identification and proposed procedure were                         verified by the physician, the nurse, the                         anesthesiologist, the anesthetist and the technician                         in the pre-procedure area in the procedure room in the                         endoscopy suite. Mental Status Examination: alert and                         oriented. Airway Examination: normal oropharyngeal  airway and neck mobility. Respiratory Examination:                         clear to auscultation. CV Examination: normal.                         Prophylactic Antibiotics: The patient does not require                          prophylactic antibiotics. Prior Anticoagulants: The                         patient has taken no anticoagulant or antiplatelet                         agents. ASA Grade Assessment: II - A patient with mild                         systemic disease. After reviewing the risks and                         benefits, the patient was deemed in satisfactory                         condition to undergo the procedure. The anesthesia                         plan was to use general anesthesia. Immediately prior                         to administration of medications, the patient was                         re-assessed for adequacy to receive sedatives. The                         heart rate, respiratory rate, oxygen saturations,                         blood pressure, adequacy of pulmonary ventilation, and                         response to care were monitored throughout the                         procedure. The physical status of the patient was                         re-assessed after the procedure.                        After obtaining informed consent, the colonoscope was                         passed under direct vision. Throughout the procedure,                         the patient's blood pressure, pulse, and oxygen  saturations were monitored continuously. The                         Colonoscope was introduced through the anus and                         advanced to the the cecum, identified by appendiceal                         orifice and ileocecal valve. The colonoscopy was                         performed without difficulty. The patient tolerated                         the procedure well. The quality of the bowel                         preparation was evaluated using the BBPS [facility] Bowel                         Preparation Scale) with scores of: Right Colon = 3,                         Transverse Colon = 3 and Left Colon = 3 (entire mucosa                          seen well with no residual staining, small fragments                         of stool or opaque liquid). The total BBPS score                         equals 9. The ileocecal valve, appendiceal orifice,                         and rectum were photographed. Findings:      The perianal and digital rectal examinations were normal. Pertinent       negatives include normal sphincter tone and no palpable rectal lesions.      The colon (entire examined portion) appeared normal.      The retroflexed view of the distal rectum and anal verge was normal and       showed no anal or rectal abnormalities. Impression:            - The entire examined colon is normal.                        - The distal rectum and anal verge are normal on                         retroflexion view.                        - No specimens collected. Recommendation:        - Discharge patient to home (with escort).                        -  Resume previous diet today.                        - Continue present medications.                        - Repeat colonoscopy in 10 years for screening                         purposes. Procedure Code(s):     --- Professional ---                        [id number], Colorectal cancer screening; colonoscopy on                         individual not meeting criteria for high risk Diagnosis Code(s):     --- Professional ---                        Z12.11, Encounter for screening for malignant neoplasm                         of colon CPT copyright 2022 American Medical Association. All rights reserved. The codes documented in this report are preliminary and upon coder review may  be revised to meet current compliance requirements. Dr. Ulyess Mort Lin Landsman MD, MD 08/19/2022 10:35:50 AM This report has been signed electronically. Number of Addenda: 0 Note Initiated On: 08/19/2022 10:04 AM Scope Withdrawal Time: 0 hours 6 minutes 55 seconds  Total Procedure Duration: 0 hours  12 minutes 29 seconds  Estimated Blood Loss:  Estimated blood loss: none.      [facility]

## 2022-08-19 NOTE — Anesthesia Postprocedure Evaluation (Signed)
Anesthesia Post Note  Patient: Mary Mason  Procedure(s) Performed: COLONOSCOPY WITH PROPOFOL  Patient location during evaluation: PACU Anesthesia Type: General Level of consciousness: awake and alert, oriented and patient cooperative Pain management: pain level controlled Vital Signs Assessment: post-procedure vital signs reviewed and stable Respiratory status: spontaneous breathing, nonlabored ventilation and respiratory function stable Cardiovascular status: blood pressure returned to baseline and stable Postop Assessment: adequate PO intake Anesthetic complications: no   No notable events documented.   Last Vitals:  Vitals:   08/19/22 1033 08/19/22 1053  BP: (!) 85/55   Pulse: 68 (!) 58  Resp:    Temp:    SpO2: 100% 99%    Last Pain:  Vitals:   08/19/22 1053  TempSrc:   PainSc: 0-No pain                 Darrin Nipper

## 2022-08-19 NOTE — Anesthesia Preprocedure Evaluation (Addendum)
Anesthesia Evaluation  Patient identified by MRN, date of birth, ID band Patient awake    Reviewed: Allergy & Precautions, NPO status , Patient's Chart, lab work & pertinent test results  History of Anesthesia Complications Negative for: history of anesthetic complications  Airway Mallampati: III   Neck ROM: Full    Dental no notable dental hx.    Pulmonary neg pulmonary ROS   Pulmonary exam normal breath sounds clear to auscultation       Cardiovascular Exercise Tolerance: Good negative cardio ROS Normal cardiovascular exam Rhythm:Regular Rate:Normal     Neuro/Psych  PSYCHIATRIC DISORDERS Anxiety Depression    negative neurological ROS     GI/Hepatic negative GI ROS,,,  Endo/Other  negative endocrine ROS    Renal/GU negative Renal ROS     Musculoskeletal  (+) Arthritis ,    Abdominal   Peds  Hematology negative hematology ROS (+)   Anesthesia Other Findings   Reproductive/Obstetrics                             Anesthesia Physical Anesthesia Plan  ASA: 2  Anesthesia Plan: General   Post-op Pain Management:    Induction: Intravenous  PONV Risk Score and Plan: 3 and Propofol infusion, TIVA and Treatment may vary due to age or medical condition  Airway Management Planned: Natural Airway  Additional Equipment:   Intra-op Plan:   Post-operative Plan:   Informed Consent: I have reviewed the patients History and Physical, chart, labs and discussed the procedure including the risks, benefits and alternatives for the proposed anesthesia with the patient or authorized representative who has indicated his/her understanding and acceptance.       Plan Discussed with: CRNA  Anesthesia Plan Comments: (LMA/GETA backup discussed.  Patient consented for risks of anesthesia including but not limited to:  - adverse reactions to medications - damage to eyes, teeth, lips or other oral  mucosa - nerve damage due to positioning  - sore throat or hoarseness - damage to heart, brain, nerves, lungs, other parts of body or loss of life  Informed patient about role of CRNA in peri- and intra-operative care.  Patient voiced understanding.)       Anesthesia Quick Evaluation

## 2022-08-20 IMAGING — MG DIGITAL SCREENING BREAST BILAT IMPLANT W/ TOMO W/ CAD
8 of 14 series · 8 of 34 positions shown · non-contrast
Comparison: Previous exam(s).

CLINICAL DATA: Screening.

EXAM:
DIGITAL SCREENING BILATERAL MAMMOGRAM WITH IMPLANTS, CAD AND
TOMOSYNTHESIS
TECHNIQUE: Bilateral screening digital craniocaudal and mediolateral oblique
mammograms were obtained. Bilateral screening digital breast
tomosynthesis was performed. The images were evaluated with
computer-aided detection. Standard and/or implant displaced views
were performed.

[R CC]
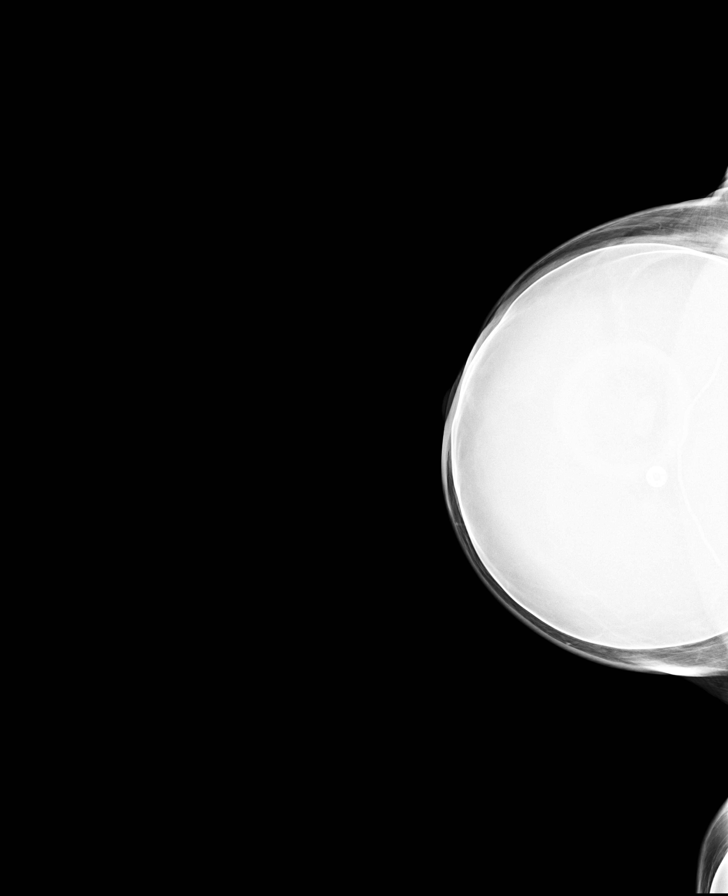

[L CC]
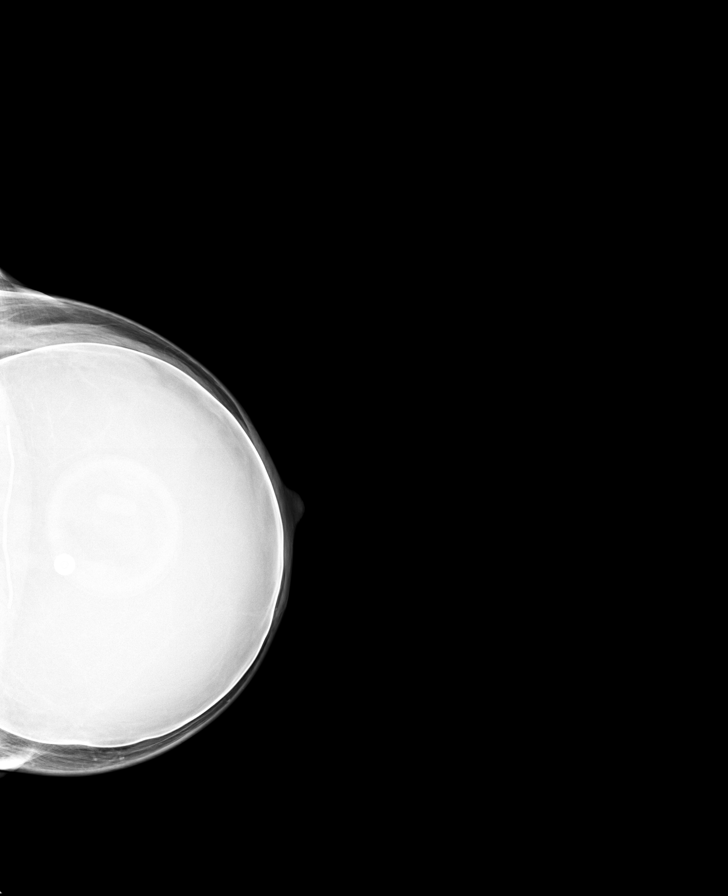

[L MLO]
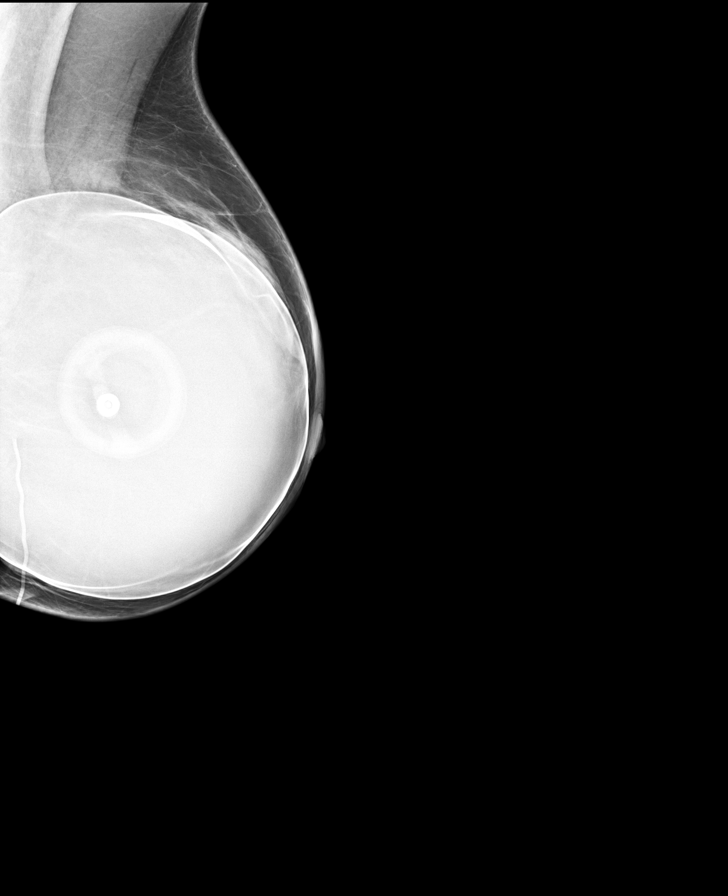

[R MLO]
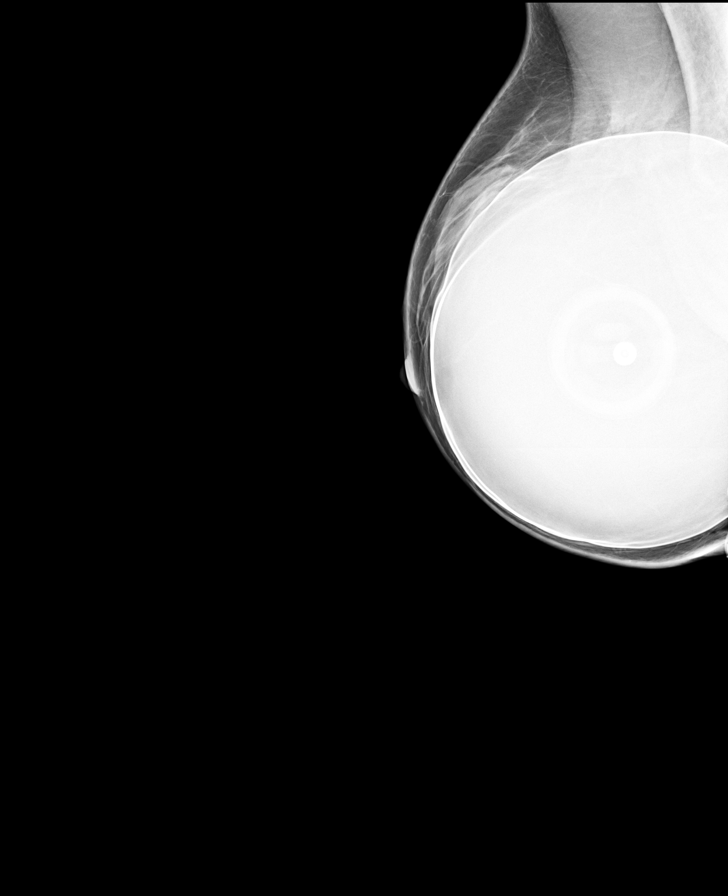

[R MLO synth-2D]
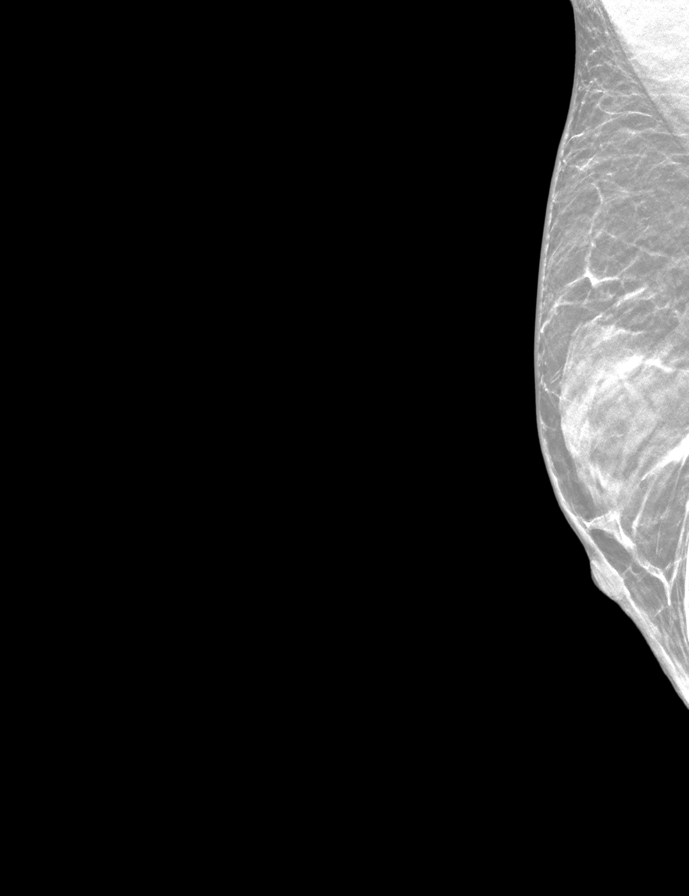

[L MLO synth-2D]
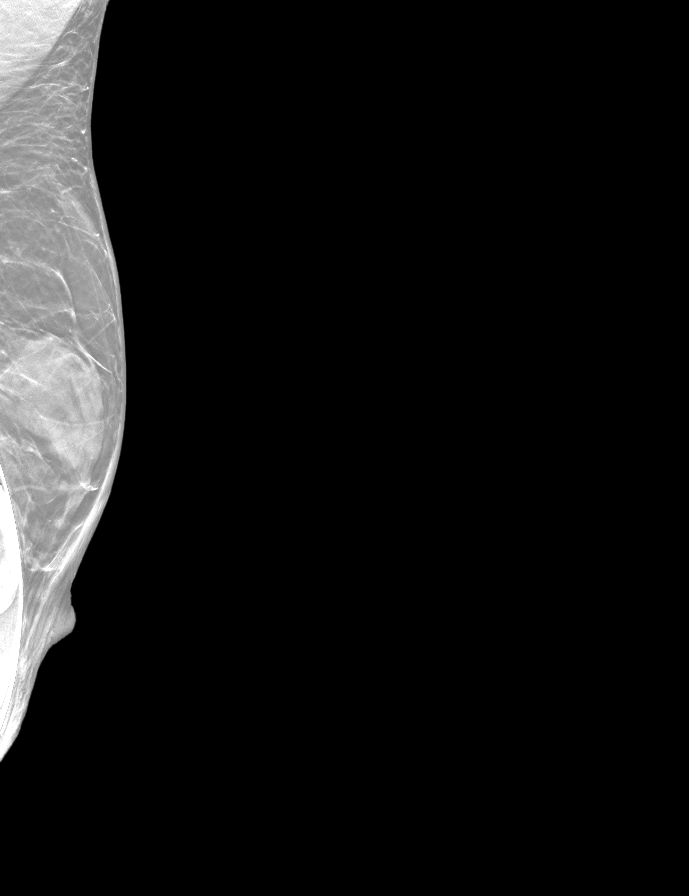

[L CC synth-2D]
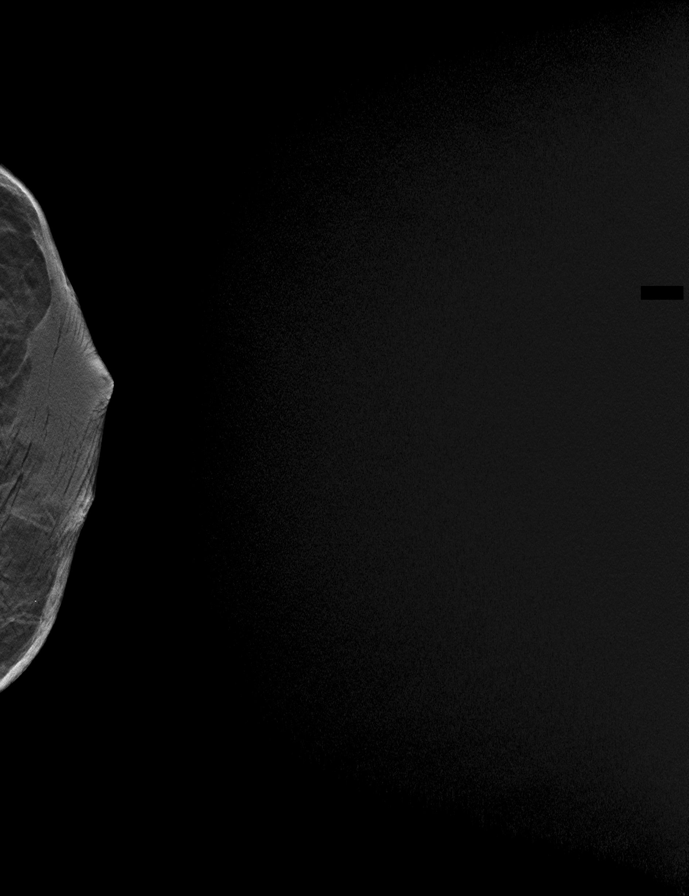

[R CC synth-2D]
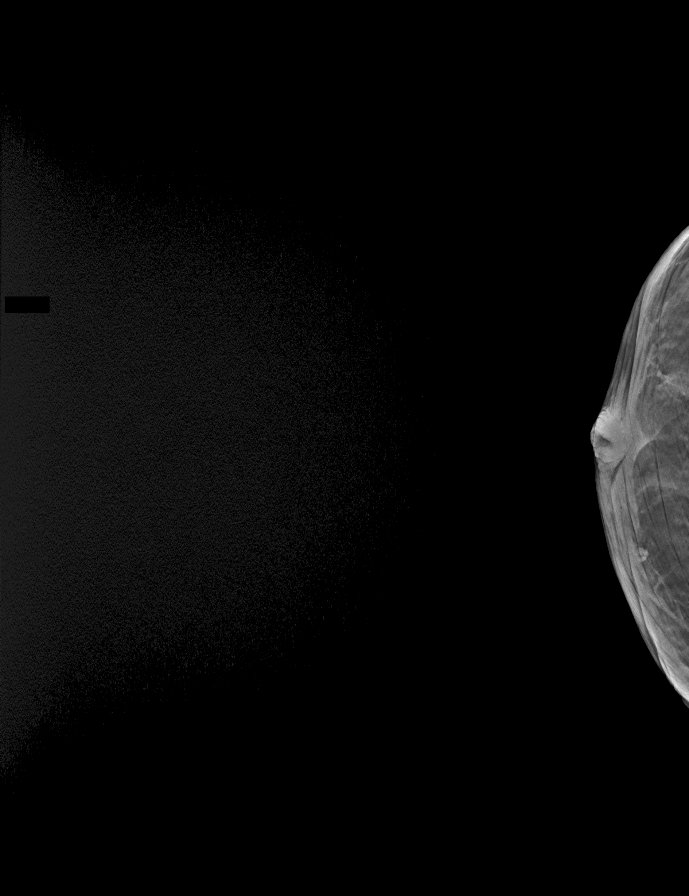

[8 of 34 positions shown; findings below may reference images not displayed]

ACR Breast Density Category d: The breast tissue is extremely dense,
which lowers the sensitivity of mammography.
FINDINGS: The patient has retropectoral implants. There are no findings
suspicious for malignancy.
IMPRESSION: No mammographic evidence of malignancy. A result letter of this
screening mammogram will be mailed directly to the patient.

RECOMMENDATION:
Screening mammogram in one year. (Code:[id number])

BI-RADS CATEGORY  1:  Negative.

## 2022-10-07 ENCOUNTER — Other Ambulatory Visit: Payer: Self-pay | Admitting: Primary Care

## 2022-10-07 DIAGNOSIS — Z124 Encounter for screening for malignant neoplasm of cervix: Secondary | ICD-10-CM

## 2022-10-07 DIAGNOSIS — F411 Generalized anxiety disorder: Secondary | ICD-10-CM

## 2022-10-16 ENCOUNTER — Other Ambulatory Visit: Payer: Self-pay | Admitting: Primary Care

## 2022-10-16 DIAGNOSIS — J3089 Other allergic rhinitis: Secondary | ICD-10-CM

## 2022-12-01 ENCOUNTER — Other Ambulatory Visit: Payer: Self-pay | Admitting: Sports Medicine

## 2022-12-03 DIAGNOSIS — F411 Generalized anxiety disorder: Secondary | ICD-10-CM

## 2022-12-04 MED ORDER — HYDROXYZINE HCL 10 MG PO TABS: 10.0000 mg | ORAL_TABLET | Freq: Two times a day (BID) | ORAL | 0 refills | Status: DC | PRN

## 2022-12-15 ENCOUNTER — Other Ambulatory Visit: Payer: Self-pay | Admitting: Primary Care

## 2022-12-15 DIAGNOSIS — F411 Generalized anxiety disorder: Secondary | ICD-10-CM

## 2022-12-19 ENCOUNTER — Other Ambulatory Visit: Payer: Self-pay | Admitting: Primary Care

## 2022-12-19 DIAGNOSIS — F411 Generalized anxiety disorder: Secondary | ICD-10-CM

## 2023-02-13 ENCOUNTER — Ambulatory Visit: Payer: 59 | Admitting: Dermatology

## 2023-02-14 ENCOUNTER — Other Ambulatory Visit: Payer: Self-pay | Admitting: Primary Care

## 2023-02-14 DIAGNOSIS — J3089 Other allergic rhinitis: Secondary | ICD-10-CM

## 2023-03-10 NOTE — Telephone Encounter (Signed)
Please set up patient for an office visit.

## 2023-03-27 ENCOUNTER — Ambulatory Visit: Payer: 59 | Admitting: Dermatology

## 2023-04-13 ENCOUNTER — Other Ambulatory Visit: Payer: Self-pay | Admitting: Primary Care

## 2023-04-13 DIAGNOSIS — J3089 Other allergic rhinitis: Secondary | ICD-10-CM

## 2023-05-14 ENCOUNTER — Other Ambulatory Visit: Payer: Self-pay

## 2023-05-14 DIAGNOSIS — J3089 Other allergic rhinitis: Secondary | ICD-10-CM

## 2023-05-14 MED ORDER — LEVOCETIRIZINE DIHYDROCHLORIDE 5 MG PO TABS: 5.0000 mg | ORAL_TABLET | Freq: Every evening | ORAL | 0 refills | Status: DC

## 2023-06-29 ENCOUNTER — Telehealth: Payer: No Typology Code available for payment source | Admitting: Physician Assistant

## 2023-06-29 DIAGNOSIS — J069 Acute upper respiratory infection, unspecified: Secondary | ICD-10-CM | POA: Diagnosis not present

## 2023-06-29 DIAGNOSIS — B9689 Other specified bacterial agents as the cause of diseases classified elsewhere: Secondary | ICD-10-CM | POA: Diagnosis not present

## 2023-06-30 ENCOUNTER — Telehealth: Payer: No Typology Code available for payment source | Admitting: Family Medicine

## 2023-06-30 DIAGNOSIS — R059 Cough, unspecified: Secondary | ICD-10-CM

## 2023-06-30 MED ORDER — BENZONATATE 100 MG PO CAPS: 100.0000 mg | ORAL_CAPSULE | Freq: Three times a day (TID) | ORAL | 0 refills | Status: DC | PRN

## 2023-06-30 MED ORDER — AZITHROMYCIN 250 MG PO TABS: ORAL_TABLET | ORAL | 0 refills | Status: AC

## 2023-06-30 NOTE — Progress Notes (Signed)
[place]   Did EVisit - duplicate visit.

## 2023-06-30 NOTE — Progress Notes (Signed)
E-Visit for Cough  We are sorry that you are not feeling well.  Here is how we plan to help!  Based on your presentation I believe you most likely have A cough due to bacteria.  When patients have a fever and a productive cough with a change in color or increased sputum production, we are concerned about bacterial bronchitis.  If left untreated it can progress to pneumonia.  If your symptoms do not improve with your treatment plan it is important that you contact your provider.   I have prescribed Azithromyin 250 mg: two tablets now and then one tablet daily for 4 additonal days    In addition you may use A non-prescription cough medication called Mucinex DM: take 2 tablets every 12 hours. and A prescription cough medication called Tessalon Perles 100mg . You may take 1-2 capsules every 8 hours as needed for your cough.  From your responses in the eVisit questionnaire you describe inflammation in the upper respiratory tract which is causing a significant cough.  This is commonly called Bronchitis and has four common causes:   Allergies Viral Infections Acid Reflux Bacterial Infection Allergies, viruses and acid reflux are treated by controlling symptoms or eliminating the cause. An example might be a cough caused by taking certain blood pressure medications. You stop the cough by changing the medication. Another example might be a cough caused by acid reflux. Controlling the reflux helps control the cough.     HOME CARE Only take medications as instructed by your medical team. Complete the entire course of an antibiotic. Drink plenty of fluids and get plenty of rest. Avoid close contacts especially the very young and the elderly Cover your mouth if you cough or cough into your sleeve. Always remember to wash your hands A steam or ultrasonic humidifier can help congestion.   GET HELP RIGHT AWAY IF: You develop worsening fever. You become short of breath You cough up blood. Your symptoms  persist after you have completed your treatment plan MAKE SURE YOU  Understand these instructions. Will watch your condition. Will get help right away if you are not doing well or get worse.    Thank you for choosing an e-visit.  Your e-visit answers were reviewed by a board certified advanced clinical practitioner to complete your personal care plan. Depending upon the condition, your plan could have included both over the counter or prescription medications.  Please review your pharmacy choice. Make sure the pharmacy is open so you can pick up prescription now. If there is a problem, you may contact your provider through [employer] and have the prescription routed to another pharmacy.  Your safety is important to [name]. If you have drug allergies check your prescription carefully.   For the next 24 hours you can use MyChart to ask questions about today's visit, request a non-urgent call back, or ask for a work or school excuse. You will get an email in the next two days asking about your experience. I hope that your e-visit has been valuable and will speed your recovery.  I have spent 5 minutes in review of e-visit questionnaire, review and updating patient chart, medical decision making and response to patient.   Margaretann Loveless, PA-C

## 2023-07-14 ENCOUNTER — Other Ambulatory Visit: Payer: Self-pay | Admitting: Primary Care

## 2023-07-14 DIAGNOSIS — R5383 Other fatigue: Secondary | ICD-10-CM

## 2023-07-15 ENCOUNTER — Encounter: Payer: Self-pay | Admitting: Primary Care

## 2023-07-15 ENCOUNTER — Ambulatory Visit (INDEPENDENT_AMBULATORY_CARE_PROVIDER_SITE_OTHER): Payer: No Typology Code available for payment source | Admitting: Primary Care

## 2023-07-15 VITALS — BP 124/64 | HR 70 | Temp 97.4°F | Ht 65.0 in | Wt 137.0 lb

## 2023-07-15 DIAGNOSIS — Z0001 Encounter for general adult medical examination with abnormal findings: Secondary | ICD-10-CM

## 2023-07-15 DIAGNOSIS — Z13220 Encounter for screening for lipoid disorders: Secondary | ICD-10-CM | POA: Diagnosis not present

## 2023-07-15 DIAGNOSIS — M25441 Effusion, right hand: Secondary | ICD-10-CM | POA: Diagnosis not present

## 2023-07-15 DIAGNOSIS — F411 Generalized anxiety disorder: Secondary | ICD-10-CM

## 2023-07-15 DIAGNOSIS — R5383 Other fatigue: Secondary | ICD-10-CM | POA: Diagnosis not present

## 2023-07-15 DIAGNOSIS — M2550 Pain in unspecified joint: Secondary | ICD-10-CM

## 2023-07-15 DIAGNOSIS — Z0000 Encounter for general adult medical examination without abnormal findings: Secondary | ICD-10-CM

## 2023-07-15 DIAGNOSIS — J3089 Other allergic rhinitis: Secondary | ICD-10-CM

## 2023-07-15 DIAGNOSIS — Z1231 Encounter for screening mammogram for malignant neoplasm of breast: Secondary | ICD-10-CM

## 2023-07-15 MED ORDER — BUPROPION HCL 75 MG PO TABS: 75.0000 mg | ORAL_TABLET | Freq: Every day | ORAL | 0 refills | Status: DC

## 2023-07-15 NOTE — Assessment & Plan Note (Signed)
 Exam representative of osteoarthritis. Negative for autoimmune arthritis last year.  Uric acid level and CBC pending today  Start ibuprofen 400 600 mg every 8 hours as needed. She will update.

## 2023-07-15 NOTE — Assessment & Plan Note (Signed)
 Immunizations UTD. Pap smear UTD. Mammogram due, orders placed. Colonoscopy UTD, due 2034  Discussed the importance of a healthy diet and regular exercise in order for weight loss, and to reduce the risk of further co-morbidity.  Exam stable. Labs pending.  Follow up in 1 year for repeat physical.

## 2023-07-15 NOTE — Assessment & Plan Note (Signed)
 Chronic and continued.   Long discussion regarding options. Continue Lexapro 10 mg, add bupropion SR 75 mg daily.   She will update.

## 2023-07-15 NOTE — Progress Notes (Addendum)
 Subjective:    Patient ID: Mary Mason, female    DOB: [birth year], 53 y.o.   MRN: [id number]  HPI  Mary Mason is a very pleasant 53 y.o. female who presents today for complete physical and follow up of chronic conditions.  Overall she feels well, but she continues to lack motivation to do things. She procrastinates often on tasks and chores. She continues to experience fatigue, can fall asleep if she sits down. She does nap several days weekly. She is managed on Lexapro  10 mg daily for anxiety for which she has been taking for nearly 2 years. Previously managed on Zoloft , switched to Lexapro  for fatigue, frequent napping, etc. Overall she feels stable regarding her anxiety. She denies snoring.  A friend told her about bupropion  which had been helpful for her energy levels and motivation.  She questions if she should start as well.  Yesterday she developed right PIP joint pain, last night she developed moderate swelling to the PIP joint with tenderness laterally. She does a lot of repetitive movement to bilateral hands in her line of work. 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   Immunizations: -Tetanus: Completed in 2019 -Influenza: Completed this season  -Shingles: Completed Shingrix series  Diet: Fair diet.  Exercise: No regular exercise.  Eye exam: Completes annually  Dental exam: Completes semi-annually    Pap Smear: January 2024 Mammogram: February 2024  Colonoscopy: Completed in February 2024, due 2034   BP Readings from Last 3 Encounters:  07/15/23 124/64  08/19/22 (!) 85/55  07/31/22 110/64      Review of Systems  Constitutional:  Negative for unexpected weight change.  HENT:  Negative for rhinorrhea.   Respiratory:  Negative for cough and shortness of breath.   Cardiovascular:  Negative for chest pain.  Gastrointestinal:  Negative for constipation and diarrhea.  Genitourinary:  Negative for difficulty  urinating and menstrual problem.  Musculoskeletal:  Positive for arthralgias and joint swelling.  Skin:  Negative for rash.  Allergic/Immunologic: Negative for environmental allergies.  Neurological:  Negative for dizziness, numbness and headaches.  Psychiatric/Behavioral:         See HPI         Past Medical History:  Diagnosis Date   Acute non-recurrent maxillary sinusitis 07/31/2022   Arthritis    Cellulitis    Chilblain lupus erythematosus    Chilblains    Depression    GAD (generalized anxiety disorder)     Social History   Socioeconomic History   Marital status: Single    Spouse name: Not on file   Number of children: Not on file   Years of education: Not on file   Highest education level: Bachelor's degree (e.g., BA, AB, BS)  Occupational History   Not on file  Tobacco Use   Smoking status: Never   Smokeless tobacco: Never  Vaping Use   Vaping status: Never Used  Substance and Sexual Activity   Alcohol use: Yes    Alcohol/week: 0.0 standard drinks of alcohol    Comment: occasional   Drug use: No   Sexual activity: Yes    Partners: Male    Birth control/protection: Pill  Other Topics Concern   Not on file  Social History Narrative   Divorced   Employed as a magazine features editor   No children.   Enjoys exercising, spending time with friends.   Social Drivers of Corporate Investment Banker Strain: Low  Risk  (07/11/2023)   Overall Financial Resource Strain (CARDIA)    Difficulty of Paying Living Expenses: Not very hard  Food Insecurity: No Food Insecurity (07/11/2023)   Hunger Vital Sign    Worried About Running Out of Food in the Last Year: Never true    Ran Out of Food in the Last Year: Never true  Transportation Needs: No Transportation Needs (07/11/2023)   PRAPARE - Administrator, Civil Service (Medical): No    Lack of Transportation (Non-Medical): No  Physical Activity: Sufficiently Active (07/11/2023)   Exercise Vital Sign     Days of Exercise per Week: 5 days    Minutes of Exercise per Session: 40 min  Stress: No Stress Concern Present (07/11/2023)   [employer] of Occupational Health - Occupational Stress Questionnaire    Feeling of Stress : Not at all  Social Connections: Moderately Isolated (07/11/2023)   Social Connection and Isolation Panel [NHANES]    Frequency of Communication with Friends and Family: Once a week    Frequency of Social Gatherings with Friends and Family: Once a week    Attends Religious Services: 1 to 4 times per year    Active Member of [employer] or Organizations: No    Attends Engineer, Structural: Not on file    Marital Status: Living with partner  Intimate Partner Violence: Unknown (07/25/2022)   Received from [facility], [facility]   HITS    Physically Hurt: Not on file    Insult or Talk Down To: Not on file    Threaten Physical Harm: Not on file    Scream or Curse: Not on file    Past Surgical History:  Procedure Laterality Date   AUGMENTATION MAMMAPLASTY     BREAST ENHANCEMENT SURGERY  2002   COLONOSCOPY WITH PROPOFOL  N/A 08/19/2022   Procedure: COLONOSCOPY WITH PROPOFOL ;  Surgeon: Unk Corinn Skiff, MD;  Location: [facility] END[facility]COPY;  Service: Gastroenterology;  Laterality: N/A;   TONSILECTOMY, ADENOIDECTOMY, BILATERAL MYRINGOTOMY AND TUBES      Family History  Problem Relation Age of Onset   Hearing loss Maternal Grandmother    Cancer Maternal Grandmother        Liver cancer   Thyroid  nodules Mother    Cancer Maternal Aunt 60       Breast cancer- died at 40   Breast cancer Maternal Aunt    Breast cancer Cousin     No Known Allergies  Current Outpatient Medications on File Prior to Visit  Medication Sig Dispense Refill   B Complex Vitamins (B COMPLEX PO) Take by mouth.     Cholecalciferol (D3 ADULT) 25 MCG (1000 UT) CHEW Chew 1 tablet (1,000 Units total) by mouth daily.     COLLAGEN PO Take by mouth.     escitalopram  (LEXAPRO ) 10 MG tablet TAKE 1  TABLET BY MOUTH ONCE DAILY FOR ANXIETY 90 tablet 2   fluticasone  (FLONASE ) 50 MCG/ACT nasal spray INSTILL 2 SPRAYS IN EACH NOSTRIL DAILY 48 g 0   hydrOXYzine  (ATARAX ) 10 MG tablet Take 1 tablet (10 mg total) by mouth 2 (two) times daily as needed for anxiety. 30 tablet 0   levocetirizine (XYZAL ) 5 MG tablet Take 1 tablet (5 mg total) by mouth every evening. For allergies 90 tablet 0   Levonorgestrel-Ethinyl Estradiol (CAMRESE LO) 0.1-0.02 & 0.01 MG tablet Take 1 tablet by mouth daily. 91 tablet 3   Multiple Vitamins-Minerals (MULTIVITAMIN WITH MINERALS) tablet Take 1 tablet by mouth daily.  OVER THE COUNTER MEDICATION Celadrin     TURMERIC PO Take by mouth.     No current facility-administered medications on file prior to visit.    BP 124/64   Pulse 70   Temp (!) 97.4 F (36.3 C) (Temporal)   Ht 5' 5 (1.651 m)   Wt 137 lb (62.1 kg)   SpO2 98%   BMI 22.80 kg/m  Objective:   Physical Exam HENT:     Right Ear: Tympanic membrane and ear canal normal.     Left Ear: Tympanic membrane and ear canal normal.  Eyes:     Pupils: Pupils are equal, round, and reactive to light.  Cardiovascular:     Rate and Rhythm: Normal rate and regular rhythm.  Pulmonary:     Effort: Pulmonary effort is normal.     Breath sounds: Normal breath sounds.  Abdominal:     General: Bowel sounds are normal.     Palpations: Abdomen is soft.     Tenderness: There is no abdominal tenderness.  Musculoskeletal:        General: Normal range of motion.     Cervical back: Neck supple.     Comments: Moderate swelling to right 3rd PIP joint. Mild to moderate swelling to entire right 3rd digit. Decrease in ROM to right 3rd PIP joint due to swelling. No warmth or erythema.  Skin:    General: Skin is warm and dry.  Neurological:     Mental Status: She is alert and oriented to person, place, and time.     Cranial Nerves: No cranial nerve deficit.     Deep Tendon Reflexes:     Reflex Scores:      Patellar  reflexes are 2+ on the right side and 2+ on the left side. Psychiatric:        Mood and Affect: Mood normal.           Assessment & Plan:  Encounter for annual general medical examination with abnormal findings in adult Assessment & Plan: Immunizations UTD. Pap smear UTD. Mammogram due, orders placed. Colonoscopy UTD, due 2034  Discussed the importance of a healthy diet and regular exercise in order for weight loss, and to reduce the risk of further co-morbidity.  Exam stable. Labs pending.  Follow up in 1 year for repeat physical.   Orders: -     Comprehensive metabolic panel -     Lipid panel -     CBC  Environmental and seasonal allergies Assessment & Plan: Controlled.  Continue Xyzal  5 mg daily and Flonase  PRN.   Fatigue, unspecified type Assessment & Plan: Chronic and continued.   Long discussion regarding options. Continue Lexapro  10 mg, add bupropion  SR 75 mg daily.   She will update.    GAD (generalized anxiety disorder) Assessment & Plan: Overall controlled, but she does exhibit some symptoms of depression.   Long discussion regarding treatment.  We decided to continue Lexapro  10 mg daily as this has been effective for anxiety. Add bupropion  75 mg daily for motivation/energy.  She will update.  Orders: -     buPROPion  HCl; Take 1 tablet (75 mg total) by mouth daily. For anxiety  Dispense: 90 tablet; Refill: 0  Screening mammogram for breast cancer -     3D Screening Mammogram, Left and Right; Future  Finger joint swelling, right Assessment & Plan: Exam representative of osteoarthritis. Negative for autoimmune arthritis last year.  Uric acid level and CBC pending today  Start ibuprofen 400  600 mg every 8 hours as needed. She will update.  Orders: -     Uric acid -     CBC  Screening for hyperlipidemia -     Lipid panel  Pain, joint, multiple sites Assessment & Plan: Chronic and continued.  Will be seeing sports medicine.           Yamilet Mcfayden K Kaydie Petsch, NP

## 2023-07-15 NOTE — Assessment & Plan Note (Signed)
 Chronic and continued.  Will be seeing sports medicine.

## 2023-07-15 NOTE — Assessment & Plan Note (Signed)
 Overall controlled, but she does exhibit some symptoms of depression.   Long discussion regarding treatment.  We decided to continue Lexapro  10 mg daily as this has been effective for anxiety. Add bupropion  75 mg daily for motivation/energy.  She will update.

## 2023-07-15 NOTE — Patient Instructions (Addendum)
 Stop by the lab prior to leaving today. I will notify you of your results once received.   [facility].   Start Ibuprofen 600 mg every 8 hours as needed for joint pain and swelling.   It was a pleasure to see you today!

## 2023-07-15 NOTE — Assessment & Plan Note (Signed)
 Controlled.  Continue Xyzal 5 mg daily and Flonase PRN.

## 2023-07-16 LAB — COMPREHENSIVE METABOLIC PANEL
ALT: 17 U/L (ref 0–35)
AST: 26 U/L (ref 0–37)
Albumin: 4.4 g/dL (ref 3.5–5.2)
Alkaline Phosphatase: 36 U/L — ABNORMAL LOW (ref 39–117)
BUN: 18 mg/dL (ref 6–23)
CO2: 27 meq/L (ref 19–32)
Calcium: 9.3 mg/dL (ref 8.4–10.5)
Chloride: 102 meq/L (ref 96–112)
Creatinine, Ser: 0.76 mg/dL (ref 0.40–1.20)
GFR: 90.07 mL/min (ref >60.00)
Glucose, Bld: 84 mg/dL (ref 70–99)
Potassium: 3.9 meq/L (ref 3.5–5.1)
Sodium: 137 meq/L (ref 135–145)
Total Bilirubin: 0.5 mg/dL (ref 0.2–1.2)
Total Protein: 6.9 g/dL (ref 6.0–8.3)

## 2023-07-16 LAB — CBC
HCT: 39 % (ref 36.0–46.0)
Hemoglobin: 12.7 g/dL (ref 12.0–15.0)
MCHC: 32.6 g/dL (ref 30.0–36.0)
MCV: 91.3 fL (ref 78.0–100.0)
Platelets: 311 10*3/uL (ref 150.0–400.0)
RBC: 4.27 Mil/uL (ref 3.87–5.11)
RDW: 13.5 % (ref 11.5–15.5)
WBC: 5 10*3/uL (ref 4.0–10.5)

## 2023-07-16 LAB — LIPID PANEL
Cholesterol: 189 mg/dL (ref 0–200)
HDL: 73.5 mg/dL (ref >39.00)
LDL Cholesterol: 98 mg/dL (ref 0–99)
NonHDL: 115.3
Total CHOL/HDL Ratio: 3
Triglycerides: 85 mg/dL (ref 0.0–149.0)
VLDL: 17 mg/dL (ref 0.0–40.0)

## 2023-07-16 LAB — URIC ACID: Uric Acid, Serum: 2.9 mg/dL (ref 2.4–7.0)

## 2023-07-27 NOTE — Progress Notes (Addendum)
 Mary Mason T. Mary Mckesson, MD, CAQ Sports Medicine [facility] [address] [place], [address]  Phone: [phone]  FAX: [phone]  Mary Mason - 53 y.o. female  MRN [id number]  Date of Birth: 05-15-1971  Date: 07/28/2023  PCP: Gabriel John, NP  Referral: Gabriel John, NP  Chief Complaint  Patient presents with   Elbow Pain    Left   Hip Pain    Right x 2 months   Subjective:   Mary Mason is a 53 y.o. very pleasant female patient with Body mass index is 23.36 kg/m. who presents with the following:  She is a very pleasant patient who I have known for many years.  She presents with some ongoing hip pain and elbow pain.  Did review the chart, and on her prior MR arthrogram she did have a significant labral tear of the right hip.  Additionally, there was partial-thickness cartilage loss at the right femoral head and high-grade partial-thickness cartilage loss in the superior right acetabulum. -January 2024, the patient did have a right-sided hip x-ray, and the plain x-ray was essentially entirely normal.  She has had intermittent anterior groin pain over the last multiple years.  For the last 2 months, this is particularly been flared up.  Pain is in the groin.  She has taken various NSAIDs without any kind of significant relief.  She does occasionally have some pain in the posterior pelvis on the same side.  She has not had any prior intervention or operative procedures on the right hip.  She also does have some pain at the medial epicondyle at the flexor tendon insertion point.  This has been an issue off and on for years, and she has really had pain and symptoms now greater than a year.  She has tried to do some rehab at home on her own.  She is very knowledgeable about altering lifting mechanics and rehab.  Review of Systems is noted in the HPI, as appropriate  Objective:   BP 100/68 (BP Location: Left Arm, Patient  Position: Sitting, Cuff Size: Normal)   Pulse 84   Temp 98.4 F (36.9 C) (Temporal)   Ht 5\' 5"  (1.651 m)   Wt 140 lb 6 oz (63.7 kg)   SpO2 99%   BMI 23.36 kg/m   GEN: No acute distress; alert,appropriate. PULM: Breathing comfortably in no respiratory distress PSYCH: Normally interactive.    HIP EXAM: SIDE: R ROM: Abduction, Flexion, Internal and External range of motion: Normal abduction.  With the hip flexed to 90 degrees there is only roughly 20 degrees of total motion. Pain with terminal IROM and EROM: Yes, worse with internal range of motion FADIR is positive GTB: NT SLR: NEG Knees: No effusion FABER: Tenderness REVERSE FABER: NT, neg Piriformis: NT at direct palpation Str: flexion: 4/5 abduction: 5/5 adduction: 4/5 Strength testing non-tender    Elbow: L Ecchymosis or edema: neg ROM: full flexion, extension, pronation, supination Shoulder ROM: Full Flexion: 5/5 Extension: 5/5 Supination: 5/5  Pronation: 5/5 Wrist ext: 5/5 Wrist flexion: 5/5 No gross bony abnormality Varus and Valgus stress: stable ECRB tenderness: neg Medial epicondyle: tender at flexor tendon insertion Lateral epicondyle, resisted wrist extension from wrist full pronation and flexion: NT Resisted pronation: very tender grip: 5/5  sensation intact   Laboratory and Imaging Data: EXAMINATION: MRI cervical spine  CLINICAL INDICATION:Neck pain  TECHNIQUE: MRI cervical spine without contrast.  COMPARISON:None  FINDINGS:  The bone marrow  signal is normal.  The craniocervical junction is normal.  The cervical spinal cord is normal.  C2-C3: Normal  C3-C4: Normal  C4-C5: Mild disc bulge. Otherwise normal  C5-C6: There is severe degenerative disc disease with a slight retrolisthesis. There are bilateral foraminal spurs. There is severe facet arthropathy on the left. The retrolisthesis is causing severe spinal stenosis with slight indention of the cord and slight increased signal in  the cord on the STIR image. There is severe bilateral neuroforaminal stenosis.  C6-C7: There is a mild disc bulge causing mild spinal stenosis. The facet joints are normal. There is a small foraminal spur on the left with moderate left neuroforaminal stenosis.  C7-T1: Normal Procedure Note  Billi Buddy, MD - 07/31/2022 Formatting of this note might be different from the original. EXAMINATION: MRI cervical spine  CLINICAL INDICATION:Neck pain  TECHNIQUE: MRI cervical spine without contrast.  COMPARISON:None  FINDINGS:  The bone marrow signal is normal.  The craniocervical junction is normal.  The cervical spinal cord is normal.  C2-C3: Normal  C3-C4: Normal  C4-C5: Mild disc bulge. Otherwise normal  C5-C6: There is severe degenerative disc disease with a slight retrolisthesis. There are bilateral foraminal spurs. There is severe facet arthropathy on the left. The retrolisthesis is causing severe spinal stenosis with slight indention of the cord and slight increased signal in the cord on the STIR image. There is severe bilateral neuroforaminal stenosis.  C6-C7: There is a mild disc bulge causing mild spinal stenosis. The facet joints are normal. There is a small foraminal spur on the left with moderate left neuroforaminal stenosis.  C7-T1: Normal   IMPRESSION: 1. Severe degenerative disc disease at C5-6 and severe facet arthropathy on the left. There is a retrolisthesis with severe spinal stenosis. There is slight indention of the cord and slight increased signal in the cord on the STIR image. There is severe bilateral neuroforaminal stenosis. 2. Mild spinal stenosis at C6-7 due to disc bulge. There is a small foraminal spur on the left with moderate left neuroforaminal stenosis.    Electronically Signed by: Opal Bill, MD on 07/31/2022 8:48 AM  CLINICAL DATA:  Right hip pain.   EXAM: DG HIP (WITH OR WITHOUT PELVIS) 2-3V RIGHT   COMPARISON:  MRI 08/12/2018    FINDINGS: The right hip is located. There is no sign of acute fracture or dislocation. No significant arthropathy. No signs of pelvic diastasis.   IMPRESSION: Negative.     Electronically Signed   By: Kimberley Penman M.D.   On: 07/16/2022 15:01    Assessment and Plan:     ICD-10-CM   1. Chronic right hip pain  M25.551 MR HIP RIGHT W CONTRAST   G89.29 DG FLUORO GUIDED NEEDLE PLC ASPIRATION/INJECTION LOC    Ambulatory referral to Orthopedic Surgery    Ambulatory referral to Orthopedic Surgery    2. Primary localized osteoarthritis of right hip  M16.11 DG FLUORO GUIDED NEEDLE PLC ASPIRATION/INJECTION LOC    3. Golfer's elbow, left  M77.02 triamcinolone  acetonide (KENALOG -40) injection 20 mg    4. Tear of right acetabular labrum, subsequent encounter  S73.191D MR HIP RIGHT W CONTRAST    DG FLUORO GUIDED NEEDLE PLC ASPIRATION/INJECTION LOC    5. Abnormal MRI  R93.89 Ambulatory referral to Orthopedic Surgery    Ambulatory referral to Orthopedic Surgery    6. Bone marrow edema  R93.7 Ambulatory referral to Orthopedic Surgery    Ambulatory referral to Orthopedic Surgery     I think the  primary issue here is more likely degenerative joint disease on the right side, although the plain x-rays look essentially normal.  I think that the primary question is if the labral tear has worsened of if the DJD is worse on MR Arththrogram compared to the plain x-ray.  In a degenerative hip, operative care for a degenerative labral tear is less likely to be definitive.  Hip strengthening is also gotten weaker, so I am going to have her work on hip flexion and abduction in particular.  We are going to do a diagnostic and therapeutic intra-articular right sided hip injection under fluoroscopy.  She also has classic medial epicondylitis on the left side.  I have reviewed with her some basic rehab, and also going to have her do some elbow and finger tendon rehab with a bucket and sand for  resistance.  Addendum: 09/15/2023  1:17 PM  The patient has received her intraarticular hip injection under fluoroscopic guidance, but she continues to do poorly.  X-ray report above shows normal hip with no significant arthritis.  At this point, we will obtain a MR Arthrogram of the R hip to assess the extent of labral tear.   Addendum: 09/29/2023 At this point, the patient's MR arthrogram of the right hip has returned.  She does have severe hip arthritis.  There is significant concern with marrow edema in the right femoral head and neck, with the differential diagnosis including multiple myeloma, metastatic cancer to the bone versus stress reaction versus idiopathic transient osteoporosis of the hip.  I have tried to reach the patient by phone, and I will now send her a MyChart message.  Think at this does need to be seen and addressed quickly by orthopedic surgery.  Given the bone marrow edema in the femoral neck, I would like to make her minimal to nonweightbearing on crutches.  09/29/2023, 13:10 PM I have been able to reach the patient by phone, and she will go on crutches to be non-weightbearing.  I am going to refer her to Orthopedic Surgery.  10/20/2023.  13:16 PM I have spoken to the patient again and the patient's best friend also spoke to me.  She was referred to Dr. Kathleene Papas.  He made a referral to [facility] orthopedic oncology given the concern in the patient's MR arthrogram of the right hip.  There is concern for primary bone malignancy, myeloma, or bony metastasis to the right hip.  I am going to refer the patient directly to a [facility] for orthopedic oncological evaluation.  MR HIP RIGHT W CONTRAST Result Date: 09/26/2023 CLINICAL DATA:  Right hip pain EXAM: MRI OF THE RIGHT HIP WITH CONTRAST (MR Arthrogram) TECHNIQUE: Multiplanar, multisequence MR imaging of the hip was performed immediately following contrast injection into the hip joint under fluoroscopic  guidance. No intravenous contrast was administered. COMPARISON:  08/12/2018 FINDINGS: Bone No hip fracture, dislocation or avascular necrosis. Marrow edema in the right femoral head and neck. SI joints are normal. No SI joint widening or erosive changes. Lower lumbar spine demonstrates no focal abnormality. Alignment Normal. No subluxation. Dysplasia None. Joint effusion Intraarticular contrast distends the right hip joint capsule. Otherwise, no joint effusions. Labrum Degenerative tearing of the superior labrum. Cartilage Full-thickness cartilage loss of the right superior femoral head and acetabulum. Capsule and ligaments Normal. Muscles and Tendons Flexors: Normal. Extensors: Normal. Abductors: Normal. Adductors: Normal. Rotators: Normal. Hamstrings: Small partial tear of the left hamstring origin. Other Findings No bursal fluid. Viscera No abnormality seen  in pelvis. No lymphadenopathy. No free fluid in the pelvis. IMPRESSION: 1. Marrow edema in the right femoral head and neck. Differential considerations include infiltrative marrow process such as metastatic disease or myeloma versus stress reaction versus idiopathic transient osteoporosis of the hip. Follow-up MRI in 3-6 months is recommended. 2. Severe osteoarthritis of the right hip. 3. Degenerative tearing of the superior labrum. 4. Small partial tear of the left hamstring origin. Electronically Signed   By: Onnie Bilis M.D.   On: 09/26/2023 16:00     Tendon Origin / Insertion Procedure Note Mary Mason 08-09-1970 Date of procedure: 07/28/2023  Procedure: Tendon Origin / Insertion Injection for Medial Epicondylitis, L Indications: Pain  Procedure Details Verbal consent was obtained from the patient. Risks including potential risk of atrophy and potential risk of skin lightening. Also, there is potential risk of ulnar nerve block and a rare risk of nerve damage.  Benefits, and alternatives were discussed. Potential complications including loss  of pigment and atrophy were discussed. Prepped with Chloraprep and Ethyl Chloride used for anesthesia. Under sterile conditions, the patient was injected at the point of maximal tenderness 1 cm distal to medial epicondyle with 1/2 cc of Lidocaine  1% and 1 cc of Depo-Medrol  20 mg with peppering. Decreased pain after injection. No complications.  Needle size: 22 gauge 1 1/2 inch Medication: 1/2 cc of Kenalog  40 mg (equaling Kenalog  20 mg)   Medication Management during today's office visit: Meds ordered this encounter  Medications   triamcinolone  acetonide (KENALOG -40) injection 20 mg   There are no discontinued medications.  Orders placed today for conditions managed today: Orders Placed This Encounter  Procedures   DG FLUORO GUIDED NEEDLE PLC ASPIRATION/INJECTION LOC   MR HIP RIGHT W CONTRAST   DG FLUORO GUIDED NEEDLE PLC ASPIRATION/INJECTION LOC   Ambulatory referral to Orthopedic Surgery   Ambulatory referral to Orthopedic Surgery    Disposition: No follow-ups on file.  [employer] One speech-to-text software was used for transcription in this dictation.  Possible transcriptional errors can occur using Animal nutritionist.   Signed,  Ranny Bye. Hyden Soley, MD   Outpatient Encounter Medications as of 07/28/2023  Medication Sig   B Complex Vitamins (B COMPLEX PO) Take by mouth.   Cholecalciferol (D3 ADULT) 25 MCG (1000 UT) CHEW Chew 1 tablet (1,000 Units total) by mouth daily.   COLLAGEN PO Take by mouth.   escitalopram  (LEXAPRO ) 10 MG tablet TAKE 1 TABLET BY MOUTH ONCE DAILY FOR ANXIETY   hydrOXYzine  (ATARAX ) 10 MG tablet Take 1 tablet (10 mg total) by mouth 2 (two) times daily as needed for anxiety.   Levonorgestrel-Ethinyl Estradiol (AMETHIA) 0.1-0.02 & 0.01 MG tablet TAKE 1 TABLET BY MOUTH EVERY DAY   Multiple Vitamins-Minerals (MULTIVITAMIN WITH MINERALS) tablet Take 1 tablet by mouth daily.   OVER THE COUNTER MEDICATION Celadrin   TURMERIC PO Take by mouth.   [DISCONTINUED]  buPROPion  (WELLBUTRIN ) 75 MG tablet Take 1 tablet (75 mg total) by mouth daily. For anxiety   [DISCONTINUED] fluticasone  (FLONASE ) 50 MCG/ACT nasal spray INSTILL 2 SPRAYS IN EACH NOSTRIL DAILY   [DISCONTINUED] levocetirizine (XYZAL ) 5 MG tablet Take 1 tablet (5 mg total) by mouth every evening. For allergies   [EXPIRED] triamcinolone  acetonide (KENALOG -40) injection 20 mg    No facility-administered encounter medications on file as of 07/28/2023.

## 2023-07-28 ENCOUNTER — Encounter: Payer: Self-pay | Admitting: Family Medicine

## 2023-07-28 ENCOUNTER — Ambulatory Visit (INDEPENDENT_AMBULATORY_CARE_PROVIDER_SITE_OTHER): Payer: No Typology Code available for payment source | Admitting: Family Medicine

## 2023-07-28 VITALS — BP 100/68 | HR 84 | Temp 98.4°F | Ht 65.0 in | Wt 140.4 lb

## 2023-07-28 DIAGNOSIS — S73191D Other sprain of right hip, subsequent encounter: Secondary | ICD-10-CM | POA: Diagnosis not present

## 2023-07-28 DIAGNOSIS — M1611 Unilateral primary osteoarthritis, right hip: Secondary | ICD-10-CM | POA: Diagnosis not present

## 2023-07-28 DIAGNOSIS — M25551 Pain in right hip: Secondary | ICD-10-CM | POA: Diagnosis not present

## 2023-07-28 DIAGNOSIS — G8929 Other chronic pain: Secondary | ICD-10-CM

## 2023-07-28 DIAGNOSIS — R9389 Abnormal findings on diagnostic imaging of other specified body structures: Secondary | ICD-10-CM

## 2023-07-28 DIAGNOSIS — M7702 Medial epicondylitis, left elbow: Secondary | ICD-10-CM | POA: Diagnosis not present

## 2023-07-28 DIAGNOSIS — R937 Abnormal findings on diagnostic imaging of other parts of musculoskeletal system: Secondary | ICD-10-CM

## 2023-07-28 MED ORDER — TRIAMCINOLONE ACETONIDE 40 MG/ML IJ SUSP
20.0000 mg | Freq: Once | INTRAMUSCULAR | Status: AC
  Administered 2023-07-28: 20 mg via INTRA_ARTICULAR

## 2023-07-30 ENCOUNTER — Ambulatory Visit: Payer: 59 | Admitting: Dermatology

## 2023-08-01 ENCOUNTER — Encounter: Payer: Self-pay | Admitting: Family Medicine

## 2023-08-01 DIAGNOSIS — M1611 Unilateral primary osteoarthritis, right hip: Secondary | ICD-10-CM

## 2023-08-01 MED ORDER — MELOXICAM 15 MG PO TABS: 15.0000 mg | ORAL_TABLET | Freq: Every day | ORAL | 0 refills | Status: DC | PRN

## 2023-08-05 ENCOUNTER — Ambulatory Visit
Admission: RE | Admit: 2023-08-05 | Discharge: 2023-08-05 | Disposition: A | Discharge status: Home / Self Care | Payer: No Typology Code available for payment source | Source: Ambulatory Visit | Attending: Primary Care | Admitting: Primary Care

## 2023-08-05 DIAGNOSIS — Z1231 Encounter for screening mammogram for malignant neoplasm of breast: Secondary | ICD-10-CM | POA: Diagnosis present

## 2023-08-07 ENCOUNTER — Encounter: Payer: Self-pay | Admitting: Family Medicine

## 2023-08-11 ENCOUNTER — Ambulatory Visit
Admission: RE | Admit: 2023-08-11 | Discharge: 2023-08-11 | Disposition: A | Discharge status: Home / Self Care | Payer: No Typology Code available for payment source | Source: Ambulatory Visit | Attending: Family Medicine | Admitting: Family Medicine

## 2023-08-11 DIAGNOSIS — M1611 Unilateral primary osteoarthritis, right hip: Secondary | ICD-10-CM

## 2023-08-11 MED ORDER — METHYLPREDNISOLONE ACETATE 40 MG/ML INJ SUSP (RADIOLOG
40.0000 mg | Freq: Once | INTRAMUSCULAR | Status: AC
  Administered 2023-08-11: 40 mg via INTRA_ARTICULAR

## 2023-08-11 MED ORDER — IOPAMIDOL (ISOVUE-M 200) INJECTION 41%
1.0000 mL | Freq: Once | INTRAMUSCULAR | Status: AC
  Administered 2023-08-11: 1 mL via INTRA_ARTICULAR

## 2023-08-27 ENCOUNTER — Encounter: Payer: Self-pay | Admitting: Family Medicine

## 2023-08-27 ENCOUNTER — Other Ambulatory Visit: Payer: Self-pay

## 2023-08-27 DIAGNOSIS — J3089 Other allergic rhinitis: Secondary | ICD-10-CM

## 2023-08-28 MED ORDER — LEVOCETIRIZINE DIHYDROCHLORIDE 5 MG PO TABS: 5.0000 mg | ORAL_TABLET | Freq: Every evening | ORAL | 2 refills | Status: DC

## 2023-08-28 NOTE — Addendum Note (Signed)
 Addended by: Hannah Beat on: 08/28/2023 08:03 AM   Modules accepted: Orders

## 2023-09-02 ENCOUNTER — Ambulatory Visit: Payer: 59 | Admitting: Dermatology

## 2023-09-08 ENCOUNTER — Encounter: Payer: Self-pay | Admitting: Family Medicine

## 2023-09-09 ENCOUNTER — Other Ambulatory Visit: Payer: Self-pay

## 2023-09-09 DIAGNOSIS — M1611 Unilateral primary osteoarthritis, right hip: Secondary | ICD-10-CM

## 2023-09-09 DIAGNOSIS — J3089 Other allergic rhinitis: Secondary | ICD-10-CM

## 2023-09-09 MED ORDER — FLUTICASONE PROPIONATE 50 MCG/ACT NA SUSP: NASAL | 0 refills | Status: DC

## 2023-09-12 ENCOUNTER — Other Ambulatory Visit: Payer: Self-pay | Admitting: Primary Care

## 2023-09-12 DIAGNOSIS — M1611 Unilateral primary osteoarthritis, right hip: Secondary | ICD-10-CM

## 2023-09-13 MED ORDER — MELOXICAM 15 MG PO TABS: 15.0000 mg | ORAL_TABLET | Freq: Every day | ORAL | 0 refills | Status: DC | PRN

## 2023-09-15 ENCOUNTER — Ambulatory Visit
Admission: RE | Admit: 2023-09-15 | Discharge: 2023-09-15 | Disposition: A | Discharge status: Home / Self Care | Source: Ambulatory Visit | Attending: Family Medicine | Admitting: Family Medicine

## 2023-09-15 DIAGNOSIS — G8929 Other chronic pain: Secondary | ICD-10-CM

## 2023-09-15 DIAGNOSIS — S73191D Other sprain of right hip, subsequent encounter: Secondary | ICD-10-CM

## 2023-09-15 MED ORDER — IOPAMIDOL (ISOVUE-M 200) INJECTION 41%
10.0000 mL | Freq: Once | INTRAMUSCULAR | Status: AC
Start: 2023-09-15 — End: 2023-09-15
  Administered 2023-09-15: 10 mL via INTRA_ARTICULAR

## 2023-09-24 ENCOUNTER — Other Ambulatory Visit: Payer: No Typology Code available for payment source

## 2023-09-29 ENCOUNTER — Encounter: Payer: Self-pay | Admitting: Family Medicine

## 2023-09-29 NOTE — Addendum Note (Signed)
 Addended by: Hannah Beat on: 09/29/2023 01:31 PM   Modules accepted: Orders

## 2023-10-02 ENCOUNTER — Ambulatory Visit: Admitting: Dermatology

## 2023-10-08 ENCOUNTER — Other Ambulatory Visit: Payer: Self-pay

## 2023-10-08 DIAGNOSIS — F411 Generalized anxiety disorder: Secondary | ICD-10-CM

## 2023-10-09 MED ORDER — BUPROPION HCL 75 MG PO TABS: 75.0000 mg | ORAL_TABLET | Freq: Every day | ORAL | 2 refills | Status: DC

## 2023-10-11 ENCOUNTER — Telehealth: Admitting: Family

## 2023-10-11 DIAGNOSIS — H6993 Unspecified Eustachian tube disorder, bilateral: Secondary | ICD-10-CM | POA: Diagnosis not present

## 2023-10-11 MED ORDER — CETIRIZINE HCL 10 MG PO TABS: 10.0000 mg | ORAL_TABLET | Freq: Every day | ORAL | 1 refills | Status: DC

## 2023-10-11 MED ORDER — FLUTICASONE PROPIONATE 50 MCG/ACT NA SUSP: 2.0000 | Freq: Every day | NASAL | 6 refills | Status: AC

## 2023-10-11 NOTE — Progress Notes (Signed)
 E-Visit for Ear Pain - Eustachian Tube Dysfunction   We are sorry that you are not feeling well. Here is how we plan to help!  Based on what you have shared with me it looks like you have Eustachian Tube Dysfunction.  Eustachian Tube Dysfunction is a condition where the tubes that connect your middle ears to your upper throat become blocked. This can lead to discomfort, hearing difficulties and a feeling of fullness in your ear. Eustachian tube dysfunction usually resolves itself in a few days. The usual symptoms include: Hearing problems Tinnitus, or ringing in your ears Clicking or popping sounds A feeling of fullness in your ears Pain that mimics an ear infection Dizziness, vertigo or balance problems A "tickling" sensation in your ears  ?Eustachian tube dysfunction symptoms may get worse in higher altitudes. This is called barotrauma, and it can happen while scuba diving, flying in an airplane or driving in the mountains.   What causes eustachian tube dysfunction? Allergies and infections (like the common cold and the flu) are the most common causes of eustachian tube dysfunction. These conditions can cause inflammation and mucus buildup, leading to blockage. GERD, or chronic acid reflux, can also cause ETD. This is because stomach acid can back up into your throat and result in inflammation. As mentioned above, altitude changes can also cause ETD.   What are some common eustachian tube dysfunction treatments? In most cases, treatment isn't necessary because ETD often resolves on its own. However, you might need treatment if your symptoms linger for more than two weeks.    Eustachian tube dysfunction treatment depends on the cause and the severity of your condition. Treatments may include home remedies, medications or, in severe cases, surgery.     HOME CARE: Sometimes simple home remedies can help with mild cases of eustachian tube dysfunction. To try and clear the blockage, you  can: Chew gum. Yawn. Swallow. Try the Valsalva maneuver (breathing out forcefully while closing your mouth and pinching your nostrils). Use a saline spray to clear out nasal passages.  MEDICATIONS: Over-the-counter medications can help if allergies are causing eustachian tube dysfunction. Try antihistamines (like cetirizine or diphenhydramine) to ease your symptoms. If you have discomfort, pain relievers -- such as acetaminophen or ibuprofen -- can help.  Sometimes intranasal glucocorticosteroids (like Flonase or Nasacort) help.  I have prescribed Fluticasone 50 mcg/spray 2 sprays in each nostril daily for 10-14 days, zyrtec 10 mg, and nasal decongestant OTC daily.     GET HELP RIGHT AWAY IF: Fever is over 102.2 degrees. You develop progressive ear pain or hearing loss. Ear symptoms persist longer than 3 days after treatment.  MAKE SURE YOU: Understand these instructions. Will watch your condition. Will get help right away if you are not doing well or get worse.  Thank you for choosing an e-visit.  Your e-visit answers were reviewed by a board certified advanced clinical practitioner to complete your personal care plan. Depending upon the condition, your plan could have included both over the counter or prescription medications.  Please review your pharmacy choice. Make sure the pharmacy is open so you can pick up the prescription now. If there is a problem, you may contact your provider through [employer] and have the prescription routed to another pharmacy.  Your safety is important to us . If you have drug allergies check your prescription carefully.   For the next 24 hours you can use MyChart to ask questions about today's visit, request a non-urgent call back, or  ask for a work or school excuse. You will get an email with a survey after your eVisit asking about your experience. We would appreciate your feedback. I hope that your e-visit has been valuable and will aid in your  recovery.   Approximately 5 minutes was spent documenting and reviewing patient's chart.

## 2023-10-19 DIAGNOSIS — H6993 Unspecified Eustachian tube disorder, bilateral: Secondary | ICD-10-CM

## 2023-10-19 DIAGNOSIS — F411 Generalized anxiety disorder: Secondary | ICD-10-CM

## 2023-10-20 ENCOUNTER — Other Ambulatory Visit: Payer: Self-pay

## 2023-10-20 ENCOUNTER — Encounter: Payer: Self-pay | Admitting: Family Medicine

## 2023-10-20 DIAGNOSIS — M1611 Unilateral primary osteoarthritis, right hip: Secondary | ICD-10-CM

## 2023-10-20 MED ORDER — MELOXICAM 15 MG PO TABS: 15.0000 mg | ORAL_TABLET | Freq: Every day | ORAL | 0 refills | Status: DC | PRN

## 2023-10-20 MED ORDER — ESCITALOPRAM OXALATE 10 MG PO TABS: 10.0000 mg | ORAL_TABLET | Freq: Every day | ORAL | 1 refills | Status: DC

## 2023-10-20 NOTE — Addendum Note (Signed)
 Addended by: Scherrie Curt on: 10/20/2023 01:21 PM   Modules accepted: Orders

## 2023-10-21 NOTE — Telephone Encounter (Signed)
 I wanted to confirm the Orthopedic Oncology appointment.  I placed the urgent referral yesterday, and I would like to be copied on the appointment date and time when this is set up.

## 2023-10-22 ENCOUNTER — Telehealth: Payer: Self-pay | Admitting: Family Medicine

## 2023-10-22 DIAGNOSIS — R937 Abnormal findings on diagnostic imaging of other parts of musculoskeletal system: Secondary | ICD-10-CM

## 2023-10-22 DIAGNOSIS — R9389 Abnormal findings on diagnostic imaging of other specified body structures: Secondary | ICD-10-CM

## 2023-10-22 DIAGNOSIS — G8929 Other chronic pain: Secondary | ICD-10-CM

## 2023-10-22 NOTE — Telephone Encounter (Signed)
 I called and spoke to [facility] Orthopedic Oncology directly myself.  They have never received a consultation from [facility] or any department from [facility].  I spoke to Lyle San, the Orthopedic Oncology navigator. His direct line is [phone] (do not give to patient)  Please send the referral to [facility] Orthopedic Oncology attention Lyle San. Their direct FAX number is [phone].  Please send this as a FAX.  They have not received anything through Epic, and Lyle San thinks this may be the problem.  Departmental phone number is [phone], option 1.  Odilia Bennett, I am going to keep you in this conversation.  I want to make sure that this loop is closed.  Rice Chamorro, I think this needs to be explored to see if there is some type of systemic breakdown when we consult [facility].  I am also going to give the patient their direct number.

## 2023-10-22 NOTE — Telephone Encounter (Signed)
 Referral for Dr. Donnamarie Gables is present (09/29/23) and per referral communications records were sent 10/20/23.  Duplicate referral entered on 10/20/23 was denied due to being duplicate request.  Patient was sent a letter in [place] on 10/20/23 with the phone number of the [facility] clinic to call and schedule appointment.  I have attempted to call pt, no answer. I left a voicemail and sent Mychart message with the number for scheduling at Dr. Alta Jersey clinic [phone].

## 2023-10-22 NOTE — Telephone Encounter (Signed)
 This is a new referral to [facility] Orthopedic Oncology following up on my referral from Monday.  There is not a separate Orthopedic Oncology referral in Epic.  On chart review, Amy Raymundo Calk denied this referral on 10/20/2023, which is entirely inappropriate.  This is a case of possible primary bone malignancy or metastatic cancer. - General Orthopedics tried to place this consult to Orthopedic Oncology, but there are delays in care, and it has not been addressed.  I am going to add an additional ASAP consult for this patient, and it needs to be addressed.  Lemont Quails, I need this to be addressed as a mishandled referral.    "Clinical concern for primary bone cancer, multiple myeloma, or bone mets in the RIGHT hip.  MRI Arthrogram of the RIGHT hip is available in Care Everywhere in Epic for the [facility] physicians to review.  Concern is for bone cancer. [facility] Orthopedic Oncology The name is Dr Donnamarie Gables at [facility] said to please fax to  [phone] Attention Tori and Dr Lolly Riser"

## 2023-10-23 NOTE — Telephone Encounter (Signed)
 Referral was faxed to Dr. Sharlett Day on 04/21 with MyChart message sent to patient as well with information to call in case she doesn't hear from them.   Resent the referral again today, 04/24.

## 2023-11-10 ENCOUNTER — Ambulatory Visit: Admitting: Dermatology

## 2023-11-10 ENCOUNTER — Encounter: Payer: Self-pay | Admitting: Dermatology

## 2023-11-10 DIAGNOSIS — D229 Melanocytic nevi, unspecified: Secondary | ICD-10-CM | POA: Diagnosis not present

## 2023-11-10 DIAGNOSIS — L821 Other seborrheic keratosis: Secondary | ICD-10-CM | POA: Diagnosis not present

## 2023-11-10 DIAGNOSIS — L578 Other skin changes due to chronic exposure to nonionizing radiation: Secondary | ICD-10-CM | POA: Diagnosis not present

## 2023-11-10 DIAGNOSIS — L814 Other melanin hyperpigmentation: Secondary | ICD-10-CM

## 2023-11-10 DIAGNOSIS — Z808 Family history of malignant neoplasm of other organs or systems: Secondary | ICD-10-CM

## 2023-11-10 DIAGNOSIS — D1801 Hemangioma of skin and subcutaneous tissue: Secondary | ICD-10-CM

## 2023-11-10 NOTE — Patient Instructions (Addendum)

## 2023-11-10 NOTE — Progress Notes (Signed)
   Follow-Up Visit   Subjective  Mary Mason is a 53 y.o. female who presents for the following: Skin Cancer Screening and Full Body Skin Exam.   The patient presents for Total-Body Skin Exam (TBSE) for skin cancer screening and mole check. The patient has spots, moles and lesions to be evaluated, some may be new or changing and the patient may have concern these could be cancer.   The following portions of the chart were reviewed this encounter and updated as appropriate: medications, allergies, medical history  Review of Systems:  No other skin or systemic complaints except as noted in HPI or Assessment and Plan.  Objective  Well appearing patient in no apparent distress; mood and affect are within normal limits.  A full examination was performed including scalp, head, eyes, ears, nose, lips, neck, chest, axillae, abdomen, back, buttocks, bilateral upper extremities, bilateral lower extremities, hands, feet, fingers, toes, fingernails, and toenails. All findings within normal limits unless otherwise noted below.   Exam of nails limited by presence of nail polish.   Relevant physical exam findings are noted in the Assessment and Plan.    Assessment & Plan   SKIN CANCER SCREENING PERFORMED TODAY.  ACTINIC DAMAGE - Chronic condition, secondary to cumulative UV/sun exposure - diffuse scaly erythematous macules with underlying dyspigmentation - Recommend daily broad spectrum sunscreen SPF 30+ to sun-exposed areas, reapply every 2 hours as needed.  - Staying in the shade or wearing long sleeves, sun glasses (UVA+UVB protection) and wide brim hats (4-inch brim around the entire circumference of the hat) are also recommended for sun protection.  - Call for new or changing lesions.  LENTIGINES, SEBORRHEIC KERATOSES, HEMANGIOMAS - Benign normal skin lesions - Benign-appearing - Call for any changes  MELANOCYTIC NEVI - Tan-brown and/or pink-flesh-colored symmetric macules and  papules - Benign appearing on exam today - Observation - Call clinic for new or changing moles - Recommend daily use of broad spectrum spf 30+ sunscreen to sun-exposed areas.   FAMILY HISTORY OF SKIN CANCER What type(s): Unsure of what type(s) Who affected: Mother     ACTINIC ELASTOSIS   LENTIGINES   SEBORRHEIC KERATOSES   CHERRY ANGIOMA   MULTIPLE BENIGN NEVI   Return in about 1 year (around 11/09/2024) for TBSE.  I, Jacquelynn Vera, CMA, am acting as scribe for Harris Liming, MD .   Documentation: I have reviewed the above documentation for accuracy and completeness, and I agree with the above.  Harris Liming, MD

## 2023-12-03 ENCOUNTER — Encounter: Payer: Self-pay | Admitting: Family Medicine

## 2023-12-23 ENCOUNTER — Encounter: Payer: Self-pay | Admitting: Family Medicine

## 2024-01-26 ENCOUNTER — Other Ambulatory Visit: Payer: Self-pay

## 2024-01-26 DIAGNOSIS — M1611 Unilateral primary osteoarthritis, right hip: Secondary | ICD-10-CM

## 2024-01-26 MED ORDER — MELOXICAM 15 MG PO TABS
15.0000 mg | ORAL_TABLET | Freq: Every day | ORAL | 0 refills | Status: AC | PRN
Start: 2024-01-26 — End: ?

## 2024-02-24 NOTE — Progress Notes (Signed)
 Stop stop    Mary Nolley T. Naiara Lombardozzi, MD, CAQ Sports Medicine [facility] [address] [place], [address]  Phone: [phone]  FAX: [phone]  Mary Mason - 53 y.o. female  MRN [id number]  Date of Birth: 1970/11/20  Date: 02/25/2024  PCP: Gretta Comer POUR, NP  Referral: Gretta Comer POUR, NP  Chief Complaint  Patient presents with   Elbow Pain   Subjective:   Mary Mason is a 53 y.o. very pleasant female patient with Body mass index is 23.51 kg/m. who presents with the following:  Discussed the use of AI scribe software for clinical note transcription with the patient, who gave verbal consent to proceed.  She is a very well-known patient presents with some ongoing left-sided chronic elbow pain.  History of Present Illness Mary Mason is a 54 year old female who presents with recurrent elbow pain.  She has been experiencing recurrent elbow pain, similar to symptoms she had in January. A corticosteroid injection at that time provided relief for about eight months. The pain resurfaced three weeks to a month ago, localized to the elbow, particularly at the tendon insertion, and is triggered by movements such as driving, washing hands, or pressing down. She reports experiencing numbness that she can localize with her finger.  Her occupation involves repetitive use of her arms, which she believes contributes to the pain more than her personal workouts. She has not engaged in specific rehabilitation exercises for her elbow recently, although she occasionally stretches it. Attempts to use a massage gun and manual pressure on the tendon have not provided significant relief.  She takes meloxicam  for hip pain, but it does not alleviate her elbow pain. She does not take meloxicam  daily, only when anticipating increased activity or after experiencing aggravation. Her hip pain is less bothersome than her elbow pain currently, primarily affecting  her ability to squat, although she can perform one-legged exercises without issue. 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  During a recent hike involving 360 stairs, her hip did not bother her more than usual, which she found encouraging. She has had a full body scan recently, which was normal, and she is hesitant to undergo another scan so soon.    Review of Systems is noted in the HPI, as appropriate  Objective:   BP 118/80   Pulse 71   Temp 98 F (36.7 C) (Temporal)   Ht 5' 5 (1.651 m)   Wt 141 lb 4 oz (64.1 kg)   SpO2 99%   BMI 23.51 kg/m   GEN: No acute distress; alert,appropriate. PULM: Breathing comfortably in no respiratory distress PSYCH: Normally interactive.    Elbow: L Ecchymosis or edema: neg ROM: full flexion, extension, pronation, supination Shoulder ROM: Full Flexion: 5/5 Extension: 5/5 Supination: 5/5  Pronation: 5/5 Wrist ext: 5/5 Wrist flexion: 5/5 No gross bony abnormality Varus and Valgus stress: stable ECRB tenderness: neg Medial epicondyle: tender at flexor tendon insertion Lateral epicondyle, resisted wrist extension from wrist full pronation and flexion: NT Resisted pronation: very tender grip: 5/5  sensation intact    Laboratory and Imaging Data:  Assessment and Plan:     ICD-10-CM   1. 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 Chronic elbow pain, left  M25.522    G89.29      Procedure only: Assessment & Plan Medial epicondylitis, left elbow Chronic medial epicondylitis with symptom recurrence. Previous  corticosteroid injection provided relief for eight months. Conservative measures ineffective. - Administer corticosteroid injection to the left elbow. - Recommend grip strengthening exercises including wrist flexion, extension, and resisted supination/pronation with light weights, focusing on eccentric exercises. - Advise use of a towel for grip work and pinch grip  exercises.    Tendon Origin / Insertion Procedure Note Mary Mason 1971-06-29 Date of procedure: 02/25/2024  Procedure: Tendon Origin / Insertion Injection for Medial Epicondylitis, L Indications: Pain  Procedure Details Verbal consent was obtained from the patient. Risks including potential risk of atrophy and potential risk of skin lightening. Also, there is potential risk of ulnar nerve block and a rare risk of nerve damage.  Benefits, and alternatives were discussed. Potential complications including loss of pigment and atrophy were discussed. Prepped with Chloraprep and Ethyl Chloride used for anesthesia. Under sterile conditions, the patient was injected at the point of maximal tenderness 1 cm distal to medial epicondyle with 1/2 cc of Lidocaine  1% and 1 cc of Depo-Medrol  20 mg with peppering. Decreased pain after injection. No complications.  Needle size: 22 gauge 1 1/2 inch Medication: 1/2 cc of Kenalog  40 mg (equaling Kenalog  20 mg)   Medication Management during today's office visit: No orders of the defined types were placed in this encounter.  There are no discontinued medications.  Orders placed today for conditions managed today: No orders of the defined types were placed in this encounter.   Disposition: No follow-ups on file.  [employer] One speech-to-text software was used for transcription in this dictation.  Possible transcriptional errors can occur using Animal nutritionist.   Signed,  Jacques DASEN. Jentri Aye, MD   Outpatient Encounter Medications as of 02/25/2024  Medication Sig   B Complex Vitamins (B COMPLEX PO) Take by mouth.   buPROPion  (WELLBUTRIN ) 75 MG tablet Take 1 tablet (75 mg total) by mouth daily. For anxiety   cetirizine  (ZYRTEC  ALLERGY) 10 MG tablet Take 1 tablet (10 mg total) by mouth daily.   Cholecalciferol (D3 ADULT) 25 MCG (1000 UT) CHEW Chew 1 tablet (1,000 Units total) by mouth daily.   COLLAGEN PO Take by mouth.   escitalopram  (LEXAPRO )  10 MG tablet Take 1 tablet (10 mg total) by mouth daily. For anxiety   fluticasone  (FLONASE ) 50 MCG/ACT nasal spray Place 2 sprays into both nostrils daily.   hydrOXYzine  (ATARAX ) 10 MG tablet Take 1 tablet (10 mg total) by mouth 2 (two) times daily as needed for anxiety.   levocetirizine (XYZAL ) 5 MG tablet Take 1 tablet (5 mg total) by mouth every evening. For allergies   Levonorgestrel-Ethinyl Estradiol (AMETHIA) 0.1-0.02 & 0.01 MG tablet TAKE 1 TABLET BY MOUTH EVERY DAY   meloxicam  (MOBIC ) 15 MG tablet Take 1 tablet (15 mg total) by mouth daily as needed for pain.   Multiple Vitamins-Minerals (MULTIVITAMIN WITH MINERALS) tablet Take 1 tablet by mouth daily.   TURMERIC PO Take by mouth.   No facility-administered encounter medications on file as of 02/25/2024.

## 2024-02-25 ENCOUNTER — Ambulatory Visit: Admitting: Family Medicine

## 2024-02-25 ENCOUNTER — Encounter: Payer: Self-pay | Admitting: Family Medicine

## 2024-02-25 VITALS — BP 118/80 | HR 71 | Temp 98.0°F | Ht 65.0 in | Wt 141.2 lb

## 2024-02-25 DIAGNOSIS — G8929 Other chronic pain: Secondary | ICD-10-CM | POA: Diagnosis not present

## 2024-02-25 DIAGNOSIS — M25522 Pain in left elbow: Secondary | ICD-10-CM

## 2024-02-25 DIAGNOSIS — M7702 Medial epicondylitis, left elbow: Secondary | ICD-10-CM | POA: Diagnosis not present

## 2024-02-25 MED ORDER — TRIAMCINOLONE ACETONIDE 40 MG/ML IJ SUSP
20.0000 mg | Freq: Once | INTRAMUSCULAR | Status: AC
  Administered 2024-02-25: 20 mg via INTRA_ARTICULAR

## 2024-02-27 ENCOUNTER — Encounter: Payer: Self-pay | Admitting: Family Medicine

## 2024-03-15 DIAGNOSIS — F411 Generalized anxiety disorder: Secondary | ICD-10-CM

## 2024-03-16 MED ORDER — HYDROXYZINE HCL 10 MG PO TABS: 10.0000 mg | ORAL_TABLET | Freq: Two times a day (BID) | ORAL | 0 refills | Status: AC | PRN

## 2024-04-11 ENCOUNTER — Other Ambulatory Visit: Payer: Self-pay | Admitting: Family

## 2024-04-11 DIAGNOSIS — H6993 Unspecified Eustachian tube disorder, bilateral: Secondary | ICD-10-CM

## 2024-04-19 ENCOUNTER — Ambulatory Visit: Admitting: Primary Care

## 2024-04-19 ENCOUNTER — Encounter: Payer: Self-pay | Admitting: Primary Care

## 2024-04-19 VITALS — BP 132/80 | HR 84 | Temp 97.5°F | Ht 65.0 in | Wt 138.0 lb

## 2024-04-19 DIAGNOSIS — N898 Other specified noninflammatory disorders of vagina: Secondary | ICD-10-CM | POA: Diagnosis not present

## 2024-04-19 DIAGNOSIS — L290 Pruritus ani: Secondary | ICD-10-CM | POA: Insufficient documentation

## 2024-04-19 HISTORY — DX: Other specified noninflammatory disorders of vagina: N89.8

## 2024-04-19 LAB — POC URINALSYSI DIPSTICK (AUTOMATED)
Bilirubin, UA: NEGATIVE
Blood, UA: NEGATIVE
Glucose, UA: NEGATIVE
Ketones, UA: NEGATIVE
Leukocytes, UA: NEGATIVE
Nitrite, UA: NEGATIVE
Protein, UA: NEGATIVE
Spec Grav, UA: <=1.005 — AB (ref 1.010–1.025)
Urobilinogen, UA: 0.2 U/dL
pH, UA: 7 (ref 5.0–8.0)

## 2024-04-19 MED ORDER — TRIAMCINOLONE ACETONIDE 0.1 % EX CREA: 1.0000 | TOPICAL_CREAM | Freq: Two times a day (BID) | CUTANEOUS | 0 refills | Status: DC

## 2024-04-19 NOTE — Progress Notes (Signed)
 Subjective:    Patient ID: Mary Mason, female    DOB: 1970-10-27, 53 y.o.   MRN: [id number]  Mary Mason is a very pleasant 53 y.o. female who presents today to discuss vaginal and rectal itching.  Symptom onset 3 weeks ago with rectal itching. She thought it was sweat from working out. She then developed vaginal itching. She's also felt a raw sensation to the rectum and vagina. Over the last week or so she's noticed itching to the scalp, back, face.   She's applied multiple products OTC including neosporin, Vaseline, Cortisone, Aquafor without improvement.  She is managed on Xyzal  5 mg daily  She does get sudan waxes, no changes in the products that were used. Her last wax was 1 month ago. She's also noticed increased sweating for the last several months.   She denies vaginal discharge, dysuria, hematuria, changes in soaps/detergents/medications, tick bites, rash.    Review of Systems  Gastrointestinal:  Negative for constipation and diarrhea.       Rectal itching  Genitourinary:  Negative for dysuria, pelvic pain and vaginal discharge.       Vaginal itching  Skin:  Negative for color change.         Past Medical History:  Diagnosis Date   Acute non-recurrent maxillary sinusitis 07/31/2022   Arthritis    Cellulitis    Chilblain lupus erythematosus    Chilblains    Depression    GAD (generalized anxiety disorder)     Social History   Socioeconomic History   Marital status: Single    Spouse name: Not on file   Number of children: Not on file   Years of education: Not on file   Highest education level: Bachelor's degree (e.g., BA, AB, BS)  Occupational History   Not on file  Tobacco Use   Smoking status: Never   Smokeless tobacco: Never  Vaping Use   Vaping status: Never Used  Substance and Sexual Activity   Alcohol use: Yes    Alcohol/week: 0.0 standard drinks of alcohol    Comment: occasional   Drug use: No   Sexual activity: Yes    Partners:  Male    Birth control/protection: Pill  Other Topics Concern   Not on file  Social History Narrative   Divorced   Employed as a Magazine features editor   No children.   Enjoys exercising, spending time with friends.   Social Drivers of Corporate investment banker Strain: Low Risk  (07/11/2023)   Overall Financial Resource Strain (CARDIA)    Difficulty of Paying Living Expenses: Not very hard  Food Insecurity: No Food Insecurity (07/11/2023)   Hunger Vital Sign    Worried About Running Out of Food in the Last Year: Never true    Ran Out of Food in the Last Year: Never true  Transportation Needs: No Transportation Needs (07/11/2023)   PRAPARE - Administrator, Civil Service (Medical): No    Lack of Transportation (Non-Medical): No  Physical Activity: Sufficiently Active (07/11/2023)   Exercise Vital Sign    Days of Exercise per Week: 5 days    Minutes of Exercise per Session: 40 min  Stress: No Stress Concern Present (07/11/2023)   [employer] of Occupational Health - Occupational Stress Questionnaire    Feeling of Stress : Not at all  Social Connections: Moderately Isolated (07/11/2023)   Social Connection and Isolation Panel    Frequency of Communication with Friends and Family: Once  a week    Frequency of Social Gatherings with Friends and Family: Once a week    Attends Religious Services: 1 to 4 times per year    Active Member of [employer] or Organizations: No    Attends Engineer, structural: Not on file    Marital Status: Living with partner  Intimate Partner Violence: Unknown (07/25/2022)   Received from [facility]   HITS    Physically Hurt: Not on file    Insult or Talk Down To: Not on file    Threaten Physical Harm: Not on file    Scream or Curse: Not on file    Past Surgical History:  Procedure Laterality Date   AUGMENTATION MAMMAPLASTY     BREAST ENHANCEMENT SURGERY  2002   COLONOSCOPY WITH PROPOFOL  N/A 08/19/2022   Procedure: COLONOSCOPY  WITH PROPOFOL ;  Surgeon: Unk Corinn Skiff, MD;  Location: [facility] ENDOSCOPY;  Service: Gastroenterology;  Laterality: N/A;   TONSILECTOMY, ADENOIDECTOMY, BILATERAL MYRINGOTOMY AND TUBES      Family History  Problem Relation Age of Onset   Hearing loss Maternal Grandmother    Cancer Maternal Grandmother        Liver cancer   Thyroid  nodules Mother    Cancer Maternal Aunt 33       Breast cancer- died at 75   Breast cancer Maternal Aunt    Breast cancer Cousin     No Known Allergies  Current Outpatient Medications on File Prior to Visit  Medication Sig Dispense Refill   B Complex Vitamins (B COMPLEX PO) Take by mouth.     buPROPion  (WELLBUTRIN ) 75 MG tablet Take 1 tablet (75 mg total) by mouth daily. For anxiety 90 tablet 2   cetirizine  (ZYRTEC  ALLERGY) 10 MG tablet Take 1 tablet (10 mg total) by mouth daily. 90 tablet 1   Cholecalciferol (D3 ADULT) 25 MCG (1000 UT) CHEW Chew 1 tablet (1,000 Units total) by mouth daily.     COLLAGEN PO Take by mouth.     escitalopram  (LEXAPRO ) 10 MG tablet Take 1 tablet (10 mg total) by mouth daily. For anxiety 90 tablet 1   fluticasone  (FLONASE ) 50 MCG/ACT nasal spray Place 2 sprays into both nostrils daily. 16 g 6   hydrOXYzine  (ATARAX ) 10 MG tablet Take 1 tablet (10 mg total) by mouth 2 (two) times daily as needed for anxiety. 30 tablet 0   levocetirizine (XYZAL ) 5 MG tablet Take 1 tablet (5 mg total) by mouth every evening. For allergies 90 tablet 2   Levonorgestrel-Ethinyl Estradiol (AMETHIA) 0.1-0.02 & 0.01 MG tablet TAKE 1 TABLET BY MOUTH EVERY DAY 91 tablet 3   meloxicam  (MOBIC ) 15 MG tablet Take 1 tablet (15 mg total) by mouth daily as needed for pain. 90 tablet 0   Multiple Vitamins-Minerals (MULTIVITAMIN WITH MINERALS) tablet Take 1 tablet by mouth daily.     TURMERIC PO Take by mouth.     No current facility-administered medications on file prior to visit.    BP (!) 148/82   Pulse 84   Temp (!) 97.5 F (36.4 C) (Temporal)   Ht 5' 5  (1.651 m)   Wt 138 lb (62.6 kg)   SpO2 100%   BMI 22.96 kg/m  Objective:   Physical Exam Exam conducted with a chaperone present.  Cardiovascular:     Rate and Rhythm: Normal rate.  Pulmonary:     Effort: Pulmonary effort is normal.  Genitourinary:    Labia:  Right: No rash.        Left: No rash.      Vagina: Vaginal discharge present.     Rectum: Tenderness present.     Comments: Erythema noted to rectum with slight skin breakdown. No obvious fissure noted.  Mild amount of whitish vaginal discharge. Skin:    General: Skin is warm and dry.  Neurological:     Mental Status: She is alert.     Physical Exam        Assessment & Plan:  Rectal itching Assessment & Plan: No obvious anal fissure noted.   Labs pending today including CMP, CBC, A1c, TSH, tapeworm testing.   Treat with triamcinolone  0.1% cream twice daily.  Discussed not to exceed 2 weeks.  Await results  Orders: -     Pinworm prep -     Comprehensive metabolic panel with GFR -     CBC -     TSH -     Hemoglobin A1c -     Triamcinolone  Acetonide; Apply 1 Application topically 2 (two) times daily. Do not exceed 2 weeks  Dispense: 30 g; Refill: 0 -     POCT Urinalysis Dipstick (Automated)  Vaginal itching Assessment & Plan: No obvious cause seen on exam. Urinalysis negative Differentials include bacterial vaginosis, Candida infection. She declines STD testing.  Wet prep ordered and pending. Await results.  Orders: -     WET PREP BY MOLECULAR PROBE -     Comprehensive metabolic panel with GFR -     CBC -     TSH -     Hemoglobin A1c -     Triamcinolone  Acetonide; Apply 1 Application topically 2 (two) times daily. Do not exceed 2 weeks  Dispense: 30 g; Refill: 0 -     POCT Urinalysis Dipstick (Automated)    Assessment and Plan Assessment & Plan         Comer MARLA Gaskins, NP     History of Present Illness

## 2024-04-19 NOTE — Patient Instructions (Signed)
 You may apply the triamcinolone  cream twice daily.  Do not exceed 2 weeks  Stop by the lab prior to leaving today. I will notify you of your results once received.   It was a pleasure to see you today!

## 2024-04-19 NOTE — Assessment & Plan Note (Signed)
 No obvious anal fissure noted.   Labs pending today including CMP, CBC, A1c, TSH, tapeworm testing.   Treat with triamcinolone  0.1% cream twice daily.  Discussed not to exceed 2 weeks.  Await results

## 2024-04-19 NOTE — Assessment & Plan Note (Addendum)
 No obvious cause seen on exam. Urinalysis negative Differentials include bacterial vaginosis, Candida infection. She declines STD testing.  Wet prep ordered and pending. Await results.

## 2024-04-20 ENCOUNTER — Ambulatory Visit: Admitting: Family

## 2024-04-20 LAB — WET PREP BY MOLECULAR PROBE
Candida species: NOT DETECTED
Gardnerella vaginalis: NOT DETECTED
MICRO NUMBER:: 17121344
SPECIMEN QUALITY:: ADEQUATE
Trichomonas vaginosis: NOT DETECTED

## 2024-04-20 LAB — COMPREHENSIVE METABOLIC PANEL WITH GFR
ALT: 24 U/L (ref 0–35)
AST: 29 U/L (ref 0–37)
Albumin: 4.3 g/dL (ref 3.5–5.2)
Alkaline Phosphatase: 30 U/L — ABNORMAL LOW (ref 39–117)
BUN: 18 mg/dL (ref 6–23)
CO2: 27 meq/L (ref 19–32)
Calcium: 8.9 mg/dL (ref 8.4–10.5)
Chloride: 103 meq/L (ref 96–112)
Creatinine, Ser: 1.15 mg/dL (ref 0.40–1.20)
GFR: 54.5 mL/min — ABNORMAL LOW (ref >60.00)
Glucose, Bld: 95 mg/dL (ref 70–99)
Potassium: 3.9 meq/L (ref 3.5–5.1)
Sodium: 138 meq/L (ref 135–145)
Total Bilirubin: 0.3 mg/dL (ref 0.2–1.2)
Total Protein: 6.4 g/dL (ref 6.0–8.3)

## 2024-04-20 LAB — TSH: TSH: 2.3 u[IU]/mL (ref 0.35–5.50)

## 2024-04-20 LAB — CBC
HCT: 37.5 % (ref 36.0–46.0)
Hemoglobin: 12.8 g/dL (ref 12.0–15.0)
MCHC: 34 g/dL (ref 30.0–36.0)
MCV: 92.3 fl (ref 78.0–100.0)
Platelets: 263 K/uL (ref 150.0–400.0)
RBC: 4.07 Mil/uL (ref 3.87–5.11)
RDW: 11.8 % (ref 11.5–15.5)
WBC: 4.4 K/uL (ref 4.0–10.5)

## 2024-04-20 LAB — HEMOGLOBIN A1C: Hgb A1c MFr Bld: 5 % (ref 4.6–6.5)

## 2024-04-21 ENCOUNTER — Ambulatory Visit: Payer: Self-pay | Admitting: Primary Care

## 2024-04-21 DIAGNOSIS — R944 Abnormal results of kidney function studies: Secondary | ICD-10-CM

## 2024-04-21 DIAGNOSIS — N898 Other specified noninflammatory disorders of vagina: Secondary | ICD-10-CM

## 2024-04-21 DIAGNOSIS — L290 Pruritus ani: Secondary | ICD-10-CM

## 2024-04-21 MED ORDER — TRIAMCINOLONE ACETONIDE 0.5 % EX OINT: 1.0000 | TOPICAL_OINTMENT | Freq: Two times a day (BID) | CUTANEOUS | 0 refills | Status: DC

## 2024-04-22 MED ORDER — FLUCONAZOLE 150 MG PO TABS: ORAL_TABLET | ORAL | 0 refills | Status: DC

## 2024-04-22 MED ORDER — PREDNISONE 20 MG PO TABS: ORAL_TABLET | ORAL | 0 refills | Status: DC

## 2024-04-22 NOTE — Telephone Encounter (Signed)
 Spoke with patient via phone regarding all concerns.   No improvement with topical steroids. Will discontinue.  Question if this is fungal. Consulted with supervising physician who agrees.  Start fluconazole 150 mg tablet.  Take 1 tablet by mouth now, repeat in 3 days.  If no improvement then start prednisone . Start prednisone  20 mg tablets. Take 2 tablets by mouth once daily in the morning for 5 days.  She understands these instructions.  Will repeat BMP.

## 2024-04-22 NOTE — Telephone Encounter (Signed)
 All questions answered via phone.

## 2024-04-26 LAB — PINWORM PREP
MICRO NUMBER:: 17127499
RESULT:: NONE SEEN
SPECIMEN QUALITY:: ADEQUATE

## 2024-04-27 ENCOUNTER — Ambulatory Visit

## 2024-04-27 DIAGNOSIS — L280 Lichen simplex chronicus: Secondary | ICD-10-CM

## 2024-04-27 DIAGNOSIS — R21 Rash and other nonspecific skin eruption: Secondary | ICD-10-CM

## 2024-04-27 MED ORDER — CLOBETASOL PROPIONATE 0.05 % EX OINT: TOPICAL_OINTMENT | CUTANEOUS | 0 refills | Status: AC

## 2024-04-27 NOTE — Patient Instructions (Addendum)
 GENITAL CARE FOR WOMEN  Genital care means the way in which women keep their genital area healthy. This part of the body (the vulva) is made up of skin, moist areas and glands. Secretions (moistness) from the vagina keep it clean and healthy. These secretions are normal; they protect the vagina and the skin.  Are there any problems with washing the genitals? Yes. The skin and moist surfaces of this part of the body are very delicate. Washing with harsh chemicals, washing too often, or rubbing too hard when drying, or using a hair dryer to dry the skin can all irritate this skin.  How do you recommend washing then? If you have problems in this area, washing with only plain, lukewarm (not hot) water is best using your hands only.  Using soap, shower gels and some cleansers can make the problems worse.  Washcloths or bath sponges can be too abrasive.  Gentle cleansing with only the hands is all that is needed.  Gently pat dry the outer skin. Do not use a hair dryer.  If instructed by your provided, then apply plain vaseline or the medication prescribed by your provider.  What about clothing? Stick to 100% cotton underwear.   Avoid thongs, tight jeans, and hose. Wash underclothes in a mild detergent. Avoid fabric softeners.  What do I use during my period? Disposable menstrual pads and tampons can be used- the best ones are of natural cotton or hypoallergenic products. Make sure that they fit properly, and change them regularly.  I recommend avoiding Always sanitary pads.  What else should I know? It is not necessary to wash the vulva every day, and it should not be washed more than once a day. Do not wash the vagina. Do not use wipes, deodorants, douches, nor other cosmetic and cleansing products. Women with a problem in this area should use only treatment prescribed by the physician.  Other products can lead to or exacerbate an eczema of the genitals.  Avoid products with multiple ingredients. Even  those that sound designed for vulvar care -- like A&D Original Ointment, baby lotion, or Vagisil -- contain chemicals that could irritate or cause contact dermatitis. In the bathroom, do not use the moist wipes.  Many things can cause an allergic reaction or irritate vulvar skin. Here are some of the more common products:  Irritants (on exposure, can cause immediate stinging or burning) Soap, bubble baths and salts, detergent, shampoo, conditioner Adult or baby wipes Panty liners and their adhesives Nylon underwear, chemically treated clothing Vaginal secretions, sweat, and urine Douches, yogurt Spermicides, lubricants Perfume, talcum powder, deodorants HPV medication, tea tree oil, Pinetarsol Alcohol and astringents Allergens (symptoms may not appear until several days after exposure) Benzocaine (in Vagisil) Neomycin Chlorhexidine (in K-Y Jelly) Propylene gylcol (a preservative used in many products including K-Y Jelly) Imidazole antifungal Fragrances Tea tree oil Latex (in condoms and diaphragms)  Adapted from ISSVD by Lauraine Pour, MD   Lubricants  Lubricant type Pros Cons  Water based (e.g. slippery stuff) Does not stain sheets; okay to use with condoms and silicone based toys It evaporates quickly and thus does not last as long. Does not work in water.  More likely to have allergens/preservatives.  Oil based (e.g. coconut oil) No preservatives, emollient, cost-effective Degrades latex condoms, toys, diaphragms, some non-latex condoms.  Stains sheets.  Silicone (e.g. uberlube) Most effective at decreasing friction. Longlasting. Effective in water. Okay with condoms. Degrades silicone toys. Some will stain sheets.  Adapted from Dr.  Wray Bouchard  Recommended Water based lubricants: Medical sales representative, Sliquid naturals H2O lubricant, Wet naturals gel lubricant  Silicone based lubricants: Jo premium, [place], Astroglide X Premium Silicone Personal Lubricant, Pjur  basic Topical corticosteroid w/ improvement decr. potency If severe, po prednisone  or IM TAC Sedating oral histamine QHS Hydroxyzine  or doxepin Consider SSRI (particularly for LSC) Cool gel packs- keep in fridge Don't forget to r/o infection & check estrogen status   Review hygiene practices and personal care products Eliminate all irritants & allergens Correct barrier: Sitz baths  Wash with lukewarm water, pat dry Apply plain vaseline after bathing    Vulvar Care for patients with Dermatitis or Itching Stop all medications to the area including over the counter medications unless instructed by our vulvar dermatology team. This includes antibacterials, antifungals, vaginal creams, hemorrhoid creams Stop all wipes and sprays to the anogenital area. Stop any home remedies including those with natural herbs, spices, or oils. Stop using lotions or creams to the area. If needed, use plain Vaseline (NOT baby vaseline). Use fragrance free products Soaps, detergents Avoid soap directly on the vulva Soaps: Dr. Starla pure Denna fragrance free soap, Old Many Murray's pure and simple body wash, Kiss my face Pure Olive Oil Soap fragrance free, Dove Bar soap Detergents: The [place], Grab Green fragrance free, [place] organic free and clear Use sanitary pads and tampons that are free of fragrance, colophony, and acrylates Natracare Honest companay Organyc Wear white cotton underwear Avoid nylons, leggings, dyed materials, free of latex and elastic. Brands to try: Cottonique, The eczema company Hypoallergnic condoms made of polyurethane, polyisoprene, or lamb skin if concern of rubber allergy.  Ensure they do not contain spermicidal lubricants. Trojan bareskin SUPRA, Durex Avanti Bare, Lifestyles SKYN, Trojan Naturalamb Use lubricants that are fragrance free, glycerin free, and paraben free Water based lubricants: Sliquid naturals H2O lubricant, Wet naturals gel  lubricant By Lauraine Pour, MD   VULVAR LICHEN SCLEROSUS   What is it?  Lichen sclerosus (LS) is a long term skin disease that mainly affects the genital skin. It usually starts around the age of menopause but may occur in children. It occurs in 1 in 1000 women and less often in men and can occur in children. The skin outside the genital region is less commonly involved. This condition appears as white, fragile, skin patches that can sometimes look crinkled, but can have a shiny and smooth surface.  What causes lichen sclerosus?  The cause of LS is unknown. It can be associated, in some patients (and/or their close relatives), with autoimmune diseases, such as thyroid  disorders or vitiligo. Autoimmune diseases occur when the cells and proteins that the body uses to fight off infection start to damage the body's own tissues and prevent their normal actions. Your health care provider may do blood tests to check for these autoimmune diseases. It can be made worse by skin irritation, like scratching, and any infection on open skin from yeast or bacteria.  LS is not an infection and is not contagious. It cannot be passed on to a sexual partner. Sometimes, the disease may occur in family members but the risk of this is unknown.  What are the symptoms and what do I see?  Itching is the most common symptom. This can be severe and may disturb sleep. Some people experience soreness and burning particularly on intercourse. Small cracks in the skin (fissures) and ulcers can occur as a result of scratching the skin, and these can be very sore. If  the anal skin is split there can be pain with bowel movements. The skin becomes pale and white in appearance. This may be patchy or involve all the vulva extending down to the skin around the anus. Small purplish/red areas may be seen on the white background. These are bruises due to tiny areas of bleeding into the skin, often because of scratching. There may be  scarring that causes loss of vulvar tissue (eg. the inner lips) or shrinkage of the entrance to the vaginar area, which can cause pain and interfere with sexual intercourse and rarely even cause problems with urination. It does not involve the vagina. Some people have no symptoms and the diagnosis may be made when the area is examined for another reason.  In about 10% of women with vulvar LS, white patches may be seen on the skin elsewhere. The common sites for this are on the back, waist area and under the breasts.  How is it diagnosed?  Health care providers familiar with the condition may diagnose it by looking at the skin and seeing the usually characteristic appearance. The diagnosis is usually confirmed by  taking a skin biopsy. This involves taking a small piece of skin, first numbing the skin using a local anesthetic to be. The skin is then looked at under the microscope looked at microscopically. This is called a biopsy. This is a simple procedure that can be done in the doctor's office with a local anesthetic.  What is the treatment?  There is no total cure for LS but the symptoms can be controlled extremely well by the use of strong steroid ointments. The appearance of the vulvar skin can usually be improved but if there is a lot of scarring, changes may not be reversed. Appropriate treatment is aimed at trying to prevent the development of further inflammation and scarring.  The most effective treatment for LS is a very strong topical steroid ointment such as clobetasol propionate or halobetasol. These are can be quite safe to ly used in the genital area for this condition. In general, a pea sized amount of the ointment is sufficient to treat the vulvar skin. Your health care provider will tell you how to use your treatment. Just a small tube is needed, 15 to 30g, which should last 3 months and usually longer. Treatment should not be stopped unless advised by your doctor as LS can recur.  Many patients find that simple moisturizers such as plain petrolatum can be helpful in addition to the strong cortisone/steroid ointments.  All skin irritation should be avoided as far as possible, as irritation may increase the symptoms of will increase LS. Any infections from yeast or bacteria must be treated. Soaps and shower gels are best avoided in the genital area. The genital skin can be washed with plain warm water or a gentle soap substitute. It is ideal to clean the vulva just using one's fingertips and warm water over the skin surface. Some people find that a saline solution (a quarter of a teaspoon of salt dissolved in a cup of water) is helpful.  You may feel itchy at times. This may be worse at a particular time of the day, usually at night, and many women wake themselves scratching. Many women cannot help scratching. The itch-scratch response is normal but treatment with the steroid ointment and the emollient will help scratching the genital area is potentially harmful as it can damage the skin and keep symptoms going for quite some time. There may  only be 2 strategies we can use to help the itch. Firstly, the condition needs to be managed properly (this is a shared job between you and your doctor). Secondly, distraction which is something only you can do. For example, if the itch is unbearable in bed, don't lie there feeling uncomfortable and unable to resist scratching. Instead, get up, find something to do which occupies your hands and your concentration. When you feel the tension from the itch is reduced, then try to go back to bed. Hopefully there will only be a short time before you begin to feel better. A first generation antihistamine such as hydroxyzine  taken at bed time may will help to control itching. It is best to keep your nails short filed down so that scratching in your sleep will not cause too much damage.  If intercourse is painful intercourse can be helped with natural gentle  lubricants may help, if dryness is a problem and you are around the time of the menopause ask your doctor about vaginal estrogens  such as plain petrolatum. Painful sexual penetration should be avoided.  What should I watch for?  Patients with LS may be a little are a little more likely to develop skin cancer in the genital affected areas. However, it only occurs in about 3-4% of patients with LS and early treatment may reduce this risk even further. Any new lumps or non-healing sores or a major change in your symptoms should be reported to your doctor if they do not respond quickly to the steroid ointment. It is a good idea to get used to examining the genital skin yourself at least monthly if you are able. We advise patients with LS to have a genital examination by their health care provider at least once a year.    By International Society for the Study of Vulvovaginal Disease Patient Information Committee Revised 2014    Due to recent changes in healthcare laws, you may see results of your pathology and/or laboratory studies on MyChart before the doctors have had a chance to review them. We understand that in some cases there may be results that are confusing or concerning to you. Please understand that not all results are received at the same time and often the doctors may need to interpret multiple results in order to provide you with the best plan of care or course of treatment. Therefore, we ask that you please give us  2 business days to thoroughly review all your results before contacting the office for clarification. Should we see a critical lab result, you will be contacted sooner.   If You Need Anything After Your Visit  If you have any questions or concerns for your doctor, please call our main line at [phone] and press option 4 to reach your doctor's medical assistant. If no one answers, please leave a voicemail as directed and we will return your call as soon as possible.  Messages left after 4 pm will be answered the following business day.   You may also send us  a message via MyChart. We typically respond to MyChart messages within 1-2 business days.  For prescription refills, please ask your pharmacy to contact our office. Our fax number is [phone].  If you have an urgent issue when the clinic is closed that cannot wait until the next business day, you can page your doctor at the number below.    Please note that while we do our best to be available for urgent issues outside of office  hours, we are not available 24/7.   If you have an urgent issue and are unable to reach us , you may choose to seek medical care at your doctor's office, retail clinic, urgent care center, or emergency room.  If you have a medical emergency, please immediately call 911 or go to the emergency department.  Pager Numbers  - Dr. Hester: [phone]  - Dr. Jackquline: [phone]  - Dr. Claudene: [phone]   - Dr. Raymund: [phone]  In the event of inclement weather, please call our main line at [phone] for an update on the status of any delays or closures.  Dermatology Medication Tips: Please keep the boxes that topical medications come in in order to help keep track of the instructions about where and how to use these. Pharmacies typically print the medication instructions only on the boxes and not directly on the medication tubes.   If your medication is too expensive, please contact our office at [phone] option 4 or send us  a message through MyChart.   We are unable to tell what your co-pay for medications will be in advance as this is different depending on your insurance coverage. However, we may be able to find a substitute medication at lower cost or fill out paperwork to get insurance to cover a needed medication.   If a prior authorization is required to get your medication covered by your insurance company, please allow us  1-2 business days to  complete this process.  Drug prices often vary depending on where the prescription is filled and some pharmacies may offer cheaper prices.  The website www.goodrx.com contains coupons for medications through different pharmacies. The prices here do not account for what the cost may be with help from insurance (it may be cheaper with your insurance), but the website can give you the price if you did not use any insurance.  - You can print the associated coupon and take it with your prescription to the pharmacy.  - You may also stop by our office during regular business hours and pick up a GoodRx coupon card.  - If you need your prescription sent electronically to a different pharmacy, notify our office through [facility] or by phone at [phone] option 4.     Si Usted Necesita Algo Despus de Su Visita  Tambin puede enviarnos un mensaje a travs de Clinical Cytogeneticist. Por lo general respondemos a los mensajes de MyChart en el transcurso de 1 a 2 das hbiles.  Para renovar recetas, por favor pida a su farmacia que se ponga en contacto con nuestra oficina. Randi lakes de fax es [place] [phone].  Si tiene un asunto urgente cuando la clnica est cerrada y que no puede esperar hasta el siguiente da hbil, puede llamar/localizar a su doctor(a) al nmero que aparece a continuacin.   Por favor, tenga en cuenta que aunque hacemos todo lo posible para estar disponibles para asuntos urgentes fuera del horario de [place], no estamos disponibles las 24 horas del da, los 7 [address]  [address] de la [place].   Si tiene un problema urgente y no puede comunicarse con nosotros, puede optar por buscar atencin mdica  en el consultorio de su doctor(a), en una clnica privada, en un centro de atencin urgente o en una sala de emergencias.  Si tiene engineer, drilling, por favor llame inmediatamente al 911 o vaya a la sala de emergencias.  Nmeros de bper  - Dr. Hester: [phone]  - Dra. Jackquline:  [phone]  - Dr. Claudene: [phone]  -  Dra. Raymund: [phone]  En caso de inclemencias del tiempo, por favor llame a nuestra lnea principal al [phone] para una actualizacin sobre el [place] de cualquier retraso o cierre.  Consejos para la medicacin en dermatologa: Por favor, guarde las cajas en las que vienen los medicamentos de uso tpico para ayudarle a seguir las instrucciones sobre dnde y cmo usarlos. Las farmacias generalmente imprimen las instrucciones del medicamento slo en las cajas y no directamente en los tubos del [place].   Si su medicamento es muy caro, por favor, pngase en contacto con landry rieger llamando al [phone] y presione la opcin 4 o envenos un mensaje a travs de Clinical Cytogeneticist.   No podemos decirle cul ser su copago por los medicamentos por adelantado ya que esto es diferente dependiendo de la cobertura de su seguro. Sin embargo, es posible que podamos encontrar un medicamento sustituto a audiological scientist un formulario para que el seguro cubra el medicamento que se considera necesario.   Si se requiere una autorizacin previa para que su compaa de seguros cubra su medicamento, por favor permtanos de 1 a 2 das hbiles para completar este proceso.  Los precios de los medicamentos varan con frecuencia dependiendo del environmental consultant de dnde se surte la receta y alguna farmacias pueden ofrecer precios ms baratos.  El sitio web www.goodrx.com tiene cupones para medicamentos de health and safety inspector. Los precios aqu no tienen en cuenta lo que podra costar con la ayuda del seguro (puede ser ms barato con su seguro), pero el sitio web puede darle el precio si no utiliz tourist information centre manager.  - Puede imprimir el cupn correspondiente y llevarlo con su receta a la farmacia.  - Tambin puede pasar por nuestra oficina durante el horario de atencin regular y education officer, museum una tarjeta de cupones de GoodRx.  - Si necesita que su receta se enve electrnicamente a una  farmacia diferente, informe a nuestra oficina a travs de MyChart de [place] o por telfono llamando al [phone] y presione la opcin 4.

## 2024-04-27 NOTE — Progress Notes (Signed)
 Subjective   Mary Mason is a 53 y.o. female who presents for the following: Rash. Patient is established patient   Today patient reports: Irritation from vagina to buttock x 1 mth. 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 She was seen by PCP who ddx with BV vs candida infection and was prescribed TMC 0.5% ointment and diflucan 150mg  one tab po QD, repeat 3 days later. She last took Diflucan two days ago. Symptoms have improved, but not resolved. Pt did have area waxed prior to irritation/rash occurring, but she contacted the technician who stated no changes in products that she has been using for 6 years.   Review of Systems:    No other skin or systemic complaints except as noted in HPI or Assessment and Plan.  The following portions of the chart were reviewed this encounter and updated as appropriate: medications, allergies, medical history  Relevant Medical History:  n/a   Objective  Well appearing patient in no apparent distress; mood and affect are within normal limits. Examination was performed of the: Focused Exam of: the vaginal and rectal area   Examination notable for: erythema and fissuring of the vaginal opening and rectum. Pink sclerotic plaques noted  Examination limited by: n/a   vagina and rectum Erythema and fissuring of the vaginal opening and rectum.   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  - To help confirm diagnosis, biopsy(s) obtained today.  - Discussed the nature of the condition and treatment options with the patient.   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  We discussed that the use of high-potency topical corticosteroid is important for preventing any further scarring, however it's use will not reverse the scarring present on exam today. - Advised the patient of an increased risk of squamous cell carcinoma of the genital area (~5%) in patients with lichen sclerosus.  She was advised to periodically palpate the labia majora for any new bumps.  We discussed the important of using the topical corticosteroids to minimize this risk. - Will initiate clobetasol 0.05% ointment bid for 4 weeks until return visit.  - Can transition to maintenance once stabalized   - showed on photos where to apply    Procedures, orders, diagnosis for this visit:  RASH AND OTHER NONSPECIFIC SKIN ERUPTION vagina and rectum    Skin / nail biopsy - vagina and rectum Type of biopsy: tangential   Informed consent: discussed and consent obtained   Timeout: patient name, date of birth, surgical site, and procedure verified   Procedure prep:  Patient was prepped and draped in usual sterile fashion Prep type:  Isopropyl alcohol Anesthesia: the lesion was anesthetized in a standard fashion   Anesthetic:  1% lidocaine  w/ epinephrine 1-100,000 buffered w/ 8.4% NaHCO3 Instrument used: scissors   Hemostasis achieved with: pressure and aluminum chloride   Outcome: patient tolerated procedure well   Post-procedure details: wound care instructions given   Additional details:  Numbed with lido. Specimen removed with nylon suture and snip technique    Rash and other nonspecific skin eruption -  Skin / nail biopsy  Other orders -     Clobetasol Propionate; Apply 1 gram topically to affected area of skin twice daily. Stop once resolved and restart as needed for flares. Avoid use on face, armpits, groin unless otherwise indicated.  Dispense: 60 g; Refill: 0   Return to clinic: Return in about 4 weeks (around 05/25/2024)  for rash follow up.  [name], CMA, am acting as scribe for Lauraine JAYSON Kanaris, MD .  Documentation: I have reviewed the above documentation for accuracy and completeness, and I agree with the above.  Lauraine JAYSON Kanaris, MD

## 2024-05-03 LAB — SURGICAL PATHOLOGY

## 2024-05-04 ENCOUNTER — Ambulatory Visit: Payer: Self-pay

## 2024-05-21 ENCOUNTER — Other Ambulatory Visit: Payer: Self-pay | Admitting: Medical Genetics

## 2024-05-25 ENCOUNTER — Ambulatory Visit

## 2024-05-25 DIAGNOSIS — L900 Lichen sclerosus et atrophicus: Secondary | ICD-10-CM

## 2024-05-25 DIAGNOSIS — L280 Lichen simplex chronicus: Secondary | ICD-10-CM

## 2024-05-25 NOTE — Progress Notes (Signed)
    Subjective   Mary Mason is a 53 y.o. female who presents for the following: Follow up of rash. Patient is established patient   Today patient reports: Patient states rash has resolved and she is only using clobetasol  as needed for flares.   Review of Systems:    No other skin or systemic complaints except as noted in HPI or Assessment and Plan.  The following portions of the chart were reviewed this encounter and updated as appropriate: medications, allergies, medical history  Relevant Medical History:  n/a   Objective  (SKPE) Well appearing patient in no apparent distress; mood and affect are within normal limits. Examination was performed of the: Genital exam female: groin, mons pubis, labia majora, labia minora, vulva, buttocks. Chaperone present in room.   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 Condition is symptomatic and bothersome, not at goal  - Discussed the nature of the condition and treatment options with the patient.   - Discussed that she does have some scarring on exam with alteration of the vulvar architecture due to her disease.  We discussed that the use of high-potency topical corticosteroid is important for preventing any further scarring, however it's use will not reverse the scarring present on exam today. - Advised the patient of an increased risk of squamous cell carcinoma of the genital area (~5%) in patients with lichen sclerosus.  She was advised to periodically palpate the labia majora for any new bumps.  We discussed the important of using the topical  corticosteroids to minimize this risk. - continue clobetasol  0.05% ointment for 3 times weekly for maintenance   Recheck in 3 months      Level of service outlined above   Patient instructions (SKPI)   Procedures, orders, diagnosis for this visit:    There are no diagnoses linked to this encounter.  Return to clinic: Return in about 3 months (around 08/25/2024) for [facility].  I, Emerick Ege, CMA am acting as scribe for Lauraine JAYSON Kanaris, MD.   Documentation: I have reviewed the above documentation for accuracy and completeness, and I agree with the above.  Lauraine JAYSON Kanaris, MD

## 2024-05-25 NOTE — Patient Instructions (Addendum)
 VULVAR LICHEN SCLEROSUS   What is it?  Lichen sclerosus (LS) is a long term skin disease that mainly affects the genital skin. It usually starts around the age of menopause but may occur in children. It occurs in 1 in 1000 women and less often in men and can occur in children. The skin outside the genital region is less commonly involved. This condition appears as white, fragile, skin patches that can sometimes look crinkled, but can have a shiny and smooth surface.  What causes lichen sclerosus?  The cause of LS is unknown. It can be associated, in some patients (and/or their close relatives), with autoimmune diseases, such as thyroid  disorders or vitiligo. Autoimmune diseases occur when the cells and proteins that the body uses to fight off infection start to damage the body's own tissues and prevent their normal actions. Your health care provider may do blood tests to check for these autoimmune diseases. It can be made worse by skin irritation, like scratching, and any infection on open skin from yeast or bacteria.  LS is not an infection and is not contagious. It cannot be passed on to a sexual partner. Sometimes, the disease may occur in family members but the risk of this is unknown.  What are the symptoms and what do I see?  Itching is the most common symptom. This can be severe and may disturb sleep. Some people experience soreness and burning particularly on intercourse. Small cracks in the skin (fissures) and ulcers can occur as a result of scratching the skin, and these can be very sore. If the anal skin is split there can be pain with bowel movements. The skin becomes pale and white in appearance. This may be patchy or involve all the vulva extending down to the skin around the anus. Small purplish/red areas may be seen on the white background. These are bruises due to tiny areas of bleeding into the skin, often because of scratching. There may be scarring that causes loss of vulvar  tissue (eg. the inner lips) or shrinkage of the entrance to the vaginar area, which can cause pain and interfere with sexual intercourse and rarely even cause problems with urination. It does not involve the vagina. Some people have no symptoms and the diagnosis may be made when the area is examined for another reason.  In about 10% of women with vulvar LS, white patches may be seen on the skin elsewhere. The common sites for this are on the back, waist area and under the breasts.  How is it diagnosed?  Health care providers familiar with the condition may diagnose it by looking at the skin and seeing the usually characteristic appearance. The diagnosis is usually confirmed by  taking a skin biopsy. This involves taking a small piece of skin, first numbing the skin using a local anesthetic to be. The skin is then looked at under the microscope looked at microscopically. This is called a biopsy. This is a simple procedure that can be done in the doctor's office with a local anesthetic.  What is the treatment?  There is no total cure for LS but the symptoms can be controlled extremely well by the use of strong steroid ointments. The appearance of the vulvar skin can usually be improved but if there is a lot of scarring, changes may not be reversed. Appropriate treatment is aimed at trying to prevent the development of further inflammation and scarring.  The most effective treatment for LS is a very strong topical  steroid ointment such as clobetasol  propionate or halobetasol. These are can be quite safe to ly used in the genital area for this condition. In general, a pea sized amount of the ointment is sufficient to treat the vulvar skin. Your health care provider will tell you how to use your treatment. Just a small tube is needed, 15 to 30g, which should last 3 months and usually longer. Treatment should not be stopped unless advised by your doctor as LS can recur. Many patients find that simple  moisturizers such as plain petrolatum can be helpful in addition to the strong cortisone/steroid ointments.  All skin irritation should be avoided as far as possible, as irritation may increase the symptoms of will increase LS. Any infections from yeast or bacteria must be treated. Soaps and shower gels are best avoided in the genital area. The genital skin can be washed with plain warm water or a gentle soap substitute. It is ideal to clean the vulva just using one's fingertips and warm water over the skin surface. Some people find that a saline solution (a quarter of a teaspoon of salt dissolved in a cup of water) is helpful.  You may feel itchy at times. This may be worse at a particular time of the day, usually at night, and many women wake themselves scratching. Many women cannot help scratching. The itch-scratch response is normal but treatment with the steroid ointment and the emollient will help scratching the genital area is potentially harmful as it can damage the skin and keep symptoms going for quite some time. There may only be 2 strategies we can use to help the itch. Firstly, the condition needs to be managed properly (this is a shared job between you and your doctor). Secondly, distraction which is something only you can do. For example, if the itch is unbearable in bed, don't lie there feeling uncomfortable and unable to resist scratching. Instead, get up, find something to do which occupies your hands and your concentration. When you feel the tension from the itch is reduced, then try to go back to bed. Hopefully there will only be a short time before you begin to feel better. A first generation antihistamine such as hydroxyzine  taken at bed time may will help to control itching. It is best to keep your nails short filed down so that scratching in your sleep will not cause too much damage.  If intercourse is painful intercourse can be helped with natural gentle lubricants may help, if dryness  is a problem and you are around the time of the menopause ask your doctor about vaginal estrogens  such as plain petrolatum. Painful sexual penetration should be avoided.  What should I watch for?  Patients with LS may be a little are a little more likely to develop skin cancer in the genital affected areas. However, it only occurs in about 3-4% of patients with LS and early treatment may reduce this risk even further. Any new lumps or non-healing sores or a major change in your symptoms should be reported to your doctor if they do not respond quickly to the steroid ointment. It is a good idea to get used to examining the genital skin yourself at least monthly if you are able. We advise patients with LS to have a genital examination by their health care provider at least once a year.    By International Society for the Study of Vulvovaginal Disease Patient Information Committee Revised 2014     Due to recent  changes in healthcare laws, you may see results of your pathology and/or laboratory studies on MyChart before the doctors have had a chance to review them. We understand that in some cases there may be results that are confusing or concerning to you. Please understand that not all results are received at the same time and often the doctors may need to interpret multiple results in order to provide you with the best plan of care or course of treatment. Therefore, we ask that you please give us  2 business days to thoroughly review all your results before contacting the office for clarification. Should we see a critical lab result, you will be contacted sooner.   If You Need Anything After Your Visit  If you have any questions or concerns for your doctor, please call our main line at [phone] and press option 4 to reach your doctor's medical assistant. If no one answers, please leave a voicemail as directed and we will return your call as soon as possible. Messages left after 4 pm will be  answered the following business day.   You may also send us  a message via MyChart. We typically respond to MyChart messages within 1-2 business days.  For prescription refills, please ask your pharmacy to contact our office. Our fax number is [phone].  If you have an urgent issue when the clinic is closed that cannot wait until the next business day, you can page your doctor at the number below.    Please note that while we do our best to be available for urgent issues outside of office hours, we are not available 24/7.   If you have an urgent issue and are unable to reach us , you may choose to seek medical care at your doctor's office, retail clinic, urgent care center, or emergency room.  If you have a medical emergency, please immediately call 911 or go to the emergency department.  Pager Numbers  - Dr. Hester: [phone]  - Dr. Jackquline: [phone]  - Dr. Claudene: [phone]   - Dr. Raymund: [phone]  In the event of inclement weather, please call our main line at [phone] for an update on the status of any delays or closures.  Dermatology Medication Tips: Please keep the boxes that topical medications come in in order to help keep track of the instructions about where and how to use these. Pharmacies typically print the medication instructions only on the boxes and not directly on the medication tubes.   If your medication is too expensive, please contact our office at [phone] option 4 or send us  a message through MyChart.   We are unable to tell what your co-pay for medications will be in advance as this is different depending on your insurance coverage. However, we may be able to find a substitute medication at lower cost or fill out paperwork to get insurance to cover a needed medication.   If a prior authorization is required to get your medication covered by your insurance company, please allow us  1-2 business days to complete this process.  Drug  prices often vary depending on where the prescription is filled and some pharmacies may offer cheaper prices.  The website www.goodrx.com contains coupons for medications through different pharmacies. The prices here do not account for what the cost may be with help from insurance (it may be cheaper with your insurance), but the website can give you the price if you did not use any insurance.  - You can print the associated coupon  and take it with your prescription to the pharmacy.  - You may also stop by our office during regular business hours and pick up a GoodRx coupon card.  - If you need your prescription sent electronically to a different pharmacy, notify our office through [facility] or by phone at [phone] option 4.     Si Usted Necesita Algo Despus de Su Visita  Tambin puede enviarnos un mensaje a travs de Clinical Cytogeneticist. Por lo general respondemos a los mensajes de MyChart en el transcurso de 1 a 2 das hbiles.  Para renovar recetas, por favor pida a su farmacia que se ponga en contacto con nuestra oficina. Randi lakes de fax es [place] [phone].  Si tiene un asunto urgente cuando la clnica est cerrada y que no puede esperar hasta el siguiente da hbil, puede llamar/localizar a su doctor(a) al nmero que aparece a continuacin.   Por favor, tenga en cuenta que aunque hacemos todo lo posible para estar disponibles para asuntos urgentes fuera del horario de Benjamin Perez, no estamos disponibles las 24 horas del da, los 7 [address]  [address] de la [place].   Si tiene un problema urgente y no puede comunicarse con nosotros, puede optar por buscar atencin mdica  en el consultorio de su doctor(a), en una clnica privada, en un centro de atencin urgente o en una sala de emergencias.  Si tiene engineer, drilling, por favor llame inmediatamente al 911 o vaya a la sala de emergencias.  Nmeros de bper  - Dr. Hester: [phone]  - Dra. Jackquline: [phone]  - Dr. Claudene:  [phone]  - Dra. Kitts: [phone]  En caso de inclemencias del [place], por favor llame a nuestra lnea principal al [phone] para una actualizacin sobre el estado de cualquier retraso o cierre.  Consejos para la medicacin en dermatologa: Por favor, guarde las cajas en las que vienen los medicamentos de uso tpico para ayudarle a seguir las instrucciones sobre dnde y cmo usarlos. Las farmacias generalmente imprimen las instrucciones del medicamento slo en las cajas y no directamente en los tubos del [place].   Si su medicamento es muy caro, por favor, pngase en contacto con landry rieger llamando al [phone] y presione la opcin 4 o envenos un mensaje a travs de Clinical Cytogeneticist.   No podemos decirle cul ser su copago por los medicamentos por adelantado ya que esto es diferente dependiendo de la cobertura de su seguro. Sin embargo, es posible que podamos encontrar un medicamento sustituto a audiological scientist un formulario para que el seguro cubra el medicamento que se considera necesario.   Si se requiere una autorizacin previa para que su compaa de seguros cubra su medicamento, por favor permtanos de 1 a 2 das hbiles para completar este proceso.  Los precios de los medicamentos varan con frecuencia dependiendo del environmental consultant de dnde se surte la receta y alguna farmacias pueden ofrecer precios ms baratos.  El sitio web www.goodrx.com tiene cupones para medicamentos de health and safety inspector. Los precios aqu no tienen en cuenta lo que podra costar con la ayuda del seguro (puede ser ms barato con su seguro), pero el sitio web puede darle el precio si no utiliz tourist information centre manager.  - Puede imprimir el cupn correspondiente y llevarlo con su receta a la farmacia.  - Tambin puede pasar por nuestra oficina durante el horario de atencin regular y education officer, museum una tarjeta de cupones de GoodRx.  - Si necesita que su receta se enve electrnicamente a una farmacia diferente, informe  a  nuestra oficina a travs de MyChart de [place] o por telfono llamando al [phone] y presione la opcin 4.

## 2024-06-14 ENCOUNTER — Inpatient Hospital Stay: Admission: RE | Admit: 2024-06-14 | Discharge: 2024-06-14 | Attending: Medical Genetics | Admitting: Medical Genetics

## 2024-06-14 ENCOUNTER — Other Ambulatory Visit: Payer: Self-pay | Admitting: Primary Care

## 2024-06-14 DIAGNOSIS — J3089 Other allergic rhinitis: Secondary | ICD-10-CM

## 2024-06-14 MED ORDER — LEVOCETIRIZINE DIHYDROCHLORIDE 5 MG PO TABS: 5.0000 mg | ORAL_TABLET | Freq: Every evening | ORAL | 0 refills | Status: AC

## 2024-06-14 NOTE — Telephone Encounter (Unsigned)
 Copied from CRM #[id number]. Topic: Clinical - Medication Refill >> Jun 14, 2024 10:42 AM Shereese L wrote: Medication: levocetirizine (XYZAL ) 5 MG tablet  Has the patient contacted their pharmacy? Yes (Agent: If no, request that the patient contact the pharmacy for the refill. If patient does not wish to contact the pharmacy document the reason why and proceed with request.) (Agent: If yes, when and what did the pharmacy advise?)  This is the patient's preferred pharmacy:  [id number] - [place], [place] - [address] AT [facility] [address] [facility] [address] [place] [address] Phone: [phone] Fax: [phone]  Is this the correct pharmacy for this prescription? Yes If no, delete pharmacy and type the correct one.   Has the prescription been filled recently? Yes  Is the patient out of the medication? Yes  Has the patient been seen for an appointment in the last year OR does the patient have an upcoming appointment? Yes  Can we respond through MyChart? Yes  Agent: Please be advised that Rx refills may take up to 3 business days. We ask that you follow-up with your pharmacy.

## 2024-06-25 ENCOUNTER — Ambulatory Visit: Payer: Self-pay

## 2024-06-25 ENCOUNTER — Telehealth: Admitting: Family Medicine

## 2024-06-25 DIAGNOSIS — N898 Other specified noninflammatory disorders of vagina: Secondary | ICD-10-CM

## 2024-06-25 DIAGNOSIS — H6993 Unspecified Eustachian tube disorder, bilateral: Secondary | ICD-10-CM

## 2024-06-25 LAB — GENECONNECT MOLECULAR SCREEN: Genetic Analysis Overall Interpretation: NEGATIVE

## 2024-06-25 MED ORDER — PREDNISONE 20 MG PO TABS: ORAL_TABLET | ORAL | 0 refills | Status: DC

## 2024-06-25 MED ORDER — PREDNISONE 20 MG PO TABS: 20.0000 mg | ORAL_TABLET | Freq: Two times a day (BID) | ORAL | 0 refills | Status: AC

## 2024-06-25 MED ORDER — PREDNISONE 20 MG PO TABS: 20.0000 mg | ORAL_TABLET | Freq: Two times a day (BID) | ORAL | 0 refills | Status: DC

## 2024-06-25 NOTE — Progress Notes (Signed)
 E-Visit forEar Pain - Eustachian Tube Dysfunction   We are sorry that you are not feeling well. Here is how we plan to help!  Based on what you have shared with me it looks like you have Eustachian Tube Dysfunction.  Eustachian Tube Dysfunction is a condition where the tubes that connect your middle ears to your upper throat become blocked. This can lead to discomfort, hearing difficulties and a feeling of fullness in your ear. Eustachian tube dysfunction usually resolves itself in a few days. The usual symptoms include: Hearing problems Tinnitus, or ringing in your ears Clicking or popping sounds A feeling of fullness in your ears Pain that mimics an ear infection Dizziness, vertigo or balance problems A tickling sensation in your ears  ?Eustachian tube dysfunction symptoms may get worse in higher altitudes. This is called barotrauma, and it can happen while scuba diving, flying in an airplane or driving in the mountains.   What causes eustachian tube dysfunction? Allergies and infections (like the common cold and the flu) are the most common causes of eustachian tube dysfunction. These conditions can cause inflammation and mucus buildup, leading to blockage. GERD, or chronic acid reflux, can also cause ETD. This is because stomach acid can back up into your throat and result in inflammation. As mentioned above, altitude changes can also cause ETD.   What are some common eustachian tube dysfunction treatments? In most cases, treatment isn't necessary because ETD often resolves on its own. However, you might need treatment if your symptoms linger for more than two weeks.    Eustachian tube dysfunction treatment depends on the cause and the severity of your condition. Treatments may include home remedies, medications or, in severe cases, surgery.     HOME CARE: Sometimes simple home remedies can help with mild cases of eustachian tube dysfunction. To try and clear the blockage, you  can: Chew gum. Yawn. Swallow. Try the Valsalva maneuver (breathing out forcefully while closing your mouth and pinching your nostrils). Use a saline spray to clear out nasal passages.  MEDICATIONS: Over-the-counter medications can help if allergies are causing eustachian tube dysfunction. Try antihistamines (like cetirizine  or diphenhydramine) to ease your symptoms. If you have discomfort, pain relievers -- such as acetaminophen or ibuprofen -- can help.  Sometimes intranasal glucocorticosteroids (like Flonase  or Nasacort ) help.  I have sent prednisone .    GET HELP RIGHT AWAY IF: Fever is over 102.2 degrees. You develop progressive ear pain or hearing loss. Ear symptoms persist longer than 3 days after treatment.  MAKE SURE YOU: Understand these instructions. Will watch your condition. Will get help right away if you are not doing well or get worse.  Thank you for choosing an e-visit.  Your e-visit answers were reviewed by a board certified advanced clinical practitioner to complete your personal care plan. Depending upon the condition, your plan could have included both over the counter or prescription medications.  Please review your pharmacy choice. Make sure the pharmacy is open so you can pick up the prescription now. If there is a problem, you may contact your provider through [employer] and have the prescription routed to another pharmacy.  Your safety is important to us . If you have drug allergies check your prescription carefully.   For the next 24 hours you can use MyChart to ask questions about today's visit, request a non-urgent call back, or ask for a work or school excuse. You will get an email with a survey after your eVisit asking about your  experience. We would appreciate your feedback. I hope that your e-visit has been valuable and will aid in your recovery.   I have spent 5 minutes in review of e-visit questionnaire, review and updating patient chart,  medical decision making and response to patient.   Cortavious Nix, FNP

## 2024-06-25 NOTE — Telephone Encounter (Signed)
 noted

## 2024-06-25 NOTE — Telephone Encounter (Signed)
 See telephone encounter.

## 2024-06-25 NOTE — Telephone Encounter (Signed)
 Spoke with patient she is going to try and do virtual with [facility]. If not able to and doesn't go to urgent care over the weekend she asked that we schedule her for our next available. I have scheduled she will call and cancel if not needed.

## 2024-06-25 NOTE — Telephone Encounter (Signed)
 Patient wanted either an appointment today or her PCP's advice Patient states that she doesn't want to risk damaging anything with her ears. There are currently no available appointments with her PCP office or any offices within the surrounding region. Patient is advised that being seen and evaluated for her symptoms is recommended and Urgent Care is recommended Patient states she isnt sure if she will go to Urgent Care---she states it depends because they are driving home  FYI Only or Action Required?: Action required by provider: request for appointment, clinical question for provider, and update on patient condition.  Patient was last seen in primary care on 04/19/2024 by Gretta Comer POUR, NP.  Called Nurse Triage reporting Otalgia.  Symptoms began several days ago.  Interventions attempted: OTC medications: xyzal , flonase , sudafed.  Symptoms are: gradually worsening.  Triage Disposition: See Physician Within 24 Hours  Patient/caregiver understands and will follow disposition?: Unsure                Copied from CRM #[id number]. Topic: Clinical - Red Word Triage >> Jun 25, 2024 10:27 AM Adelita E wrote: [facility] that prompted transfer to Nurse Triage: Congested sinuses leading to ear aches. Patient stated her left ear is now worse and is hard to hear out of it. Reason for Disposition  Earache  (Exceptions: Brief ear pain of lasting less than 60 minutes, or earache occurring during air travel.)  Answer Assessment - Initial Assessment Questions Patient states she has been having sinus pressure/pain Patient states she has been taking Sudafed Right ear started having an ear ache after popping a few days ago. That has calmed down and now her left ear feels clogged and starting to get hard to hear out of Patient also using Flonase  twice a day & Xyzal  She denies any known fever, body aches, chills Blowing clear phlegm out of her nose, sometimes with a hint of  yellow She wanted either an appointment today or her PCP's advice  8. PREGNANCY: Is there any chance you are pregnant? When was your last menstrual period?     No  Patient states that she doesn't want to risk damaging anything with her ears. There are currently no available appointments with her PCP office or any offices within the surrounding region. Patient is advised that being seen and evaluated for her symptoms is recommended and Urgent Care is recommended Patient states she isnt sure if she will go to Urgent Care---she states it depends because they are driving home Patient is advised to call us  back if anything changes or with any further questions/concerns. Patient verbalized understanding.  Protocols used: Rilla

## 2024-06-29 ENCOUNTER — Ambulatory Visit: Admitting: Family Medicine

## 2024-07-05 NOTE — Telephone Encounter (Signed)
 Looks like patient had e visit but has not been seen in person. Do you need to see in office?

## 2024-07-09 ENCOUNTER — Ambulatory Visit: Admitting: Primary Care

## 2024-07-09 VITALS — BP 122/76 | HR 78 | Temp 98.1°F | Ht 65.0 in | Wt 136.0 lb

## 2024-07-09 DIAGNOSIS — H938X3 Other specified disorders of ear, bilateral: Secondary | ICD-10-CM | POA: Insufficient documentation

## 2024-07-09 DIAGNOSIS — R5383 Other fatigue: Secondary | ICD-10-CM | POA: Diagnosis not present

## 2024-07-09 DIAGNOSIS — H6120 Impacted cerumen, unspecified ear: Secondary | ICD-10-CM | POA: Insufficient documentation

## 2024-07-09 DIAGNOSIS — H6121 Impacted cerumen, right ear: Secondary | ICD-10-CM | POA: Diagnosis not present

## 2024-07-09 DIAGNOSIS — H6992 Unspecified Eustachian tube disorder, left ear: Secondary | ICD-10-CM | POA: Diagnosis not present

## 2024-07-09 MED ORDER — LEVONORGEST-ETH ESTRAD 91-DAY 0.1-0.02 & 0.01 MG PO TABS: 1.0000 | ORAL_TABLET | Freq: Every day | ORAL | 3 refills | Status: AC

## 2024-07-09 NOTE — Addendum Note (Signed)
 Addended by: Aideliz Garmany K on: 07/09/2024 02:49 PM   Modules accepted: Orders

## 2024-07-09 NOTE — Assessment & Plan Note (Addendum)
 Exam today consistent.  Discussed to stop all Sudafed due to risks of rebound symptoms.  Discussed this today Continue Flonas and Zyrtec .   Referral placed to ENT in case symptoms do not improve.  We discussed that symptoms may last for another week or so.

## 2024-07-09 NOTE — Assessment & Plan Note (Addendum)
 Likely a combination of eustachian tube dysfunction and right cerumen impaction There is no evidence for infection  She will discontinue Sudafed.  Continue Xyzal  5 mg daily, Flonase  daily.  Referral placed to ENT in case symptoms not improve. Right ear irrigated.

## 2024-07-09 NOTE — Patient Instructions (Addendum)
 Continue Flonase  and Xyzal .  Stop Sudafed  Try Debrox drops for your ear wax.  You will either be contacted via phone regarding your referral to ENT, or you may receive a letter on your MyChart portal from our referral team with instructions for scheduling an appointment. Please let us  know if you have not been contacted by anyone within two weeks.  It was a pleasure to see you today!

## 2024-07-09 NOTE — Assessment & Plan Note (Addendum)
 Unable to tolerate a few seconds of irrigation.  Discussed Debrox drops, avoiding ear buds.

## 2024-07-09 NOTE — Progress Notes (Addendum)
 "  Subjective:    Patient ID: Mary Mason, female    DOB: Oct 01, 1970, 54 y.o.   MRN: [id number]  Mary Mason is a very pleasant 54 y.o. female with a history of eustachian tube dysfunction, vertigo who presents today to discuss ear fullness.  She is also needing a refill of her birth control pills  She presents today with a nearly 3-week history of bilateral ear fullness despite Sudafed and Flonase .    Initially evaluated through an e-visit on 06/25/2024 for the same.  She was diagnosed with eustachian tube dysfunction and told to try Zyrtec , ibuprofen/Tylenol, Flonase .  She was already taking these medications.  She then presented to urgent care on 06/27/2024 for increasing sinus pressure and left ear pain for a total of 5 days.  She had been exposed to an ear infection by her brother.  She was diagnosed with otitis media of the left ear and sinusitis and was prescribed amoxicillin -clavulanate twice daily x 7 days. She took about 2 weeks worth of Sudafed.   She denies rhinorrhea, ear pain, sore throat, cough. She tested negative for Covid-19 infection and influenza.  Her symptoms mostly worry her as her brother contracted a viral infection years ago and lost 60% of his hearing.  Review of Systems  Constitutional:  Negative for chills and fever.  HENT:  Positive for postnasal drip and sinus pressure. Negative for rhinorrhea.        Ear fullness  Respiratory:  Negative for cough.          Past Medical History:  Diagnosis Date   Acute non-recurrent maxillary sinusitis 07/31/2022   Arthritis    Cellulitis    Chilblain lupus erythematosus    Chilblains    Depression    GAD (generalized anxiety disorder)     Social History   Socioeconomic History   Marital status: Single    Spouse name: Not on file   Number of children: Not on file   Years of education: Not on file   Highest education level: Bachelor's degree (e.g., BA, AB, BS)  Occupational History   Not on file   Tobacco Use   Smoking status: Never   Smokeless tobacco: Never  Vaping Use   Vaping status: Never Used  Substance and Sexual Activity   Alcohol use: Yes    Alcohol/week: 0.0 standard drinks of alcohol    Comment: occasional   Drug use: No   Sexual activity: Yes    Partners: Male    Birth control/protection: Pill  Other Topics Concern   Not on file  Social History Narrative   Divorced   Employed as a magazine features editor   No children.   Enjoys exercising, spending time with friends.   Social Drivers of Health   Tobacco Use: Low Risk (07/09/2024)   Patient History    Smoking Tobacco Use: Never    Smokeless Tobacco Use: Never    Passive Exposure: Not on file  Financial Resource Strain: Low Risk (07/09/2024)   Overall Financial Resource Strain (CARDIA)    Difficulty of Paying Living Expenses: Not very hard  Food Insecurity: No Food Insecurity (07/09/2024)   Epic    Worried About Programme Researcher, Broadcasting/film/video in the Last Year: Never true    Ran Out of Food in the Last Year: Never true  Transportation Needs: No Transportation Needs (07/09/2024)   Epic    Lack of Transportation (Medical): No    Lack of Transportation (Non-Medical): No  Physical Activity:  Sufficiently Active (07/09/2024)   Exercise Vital Sign    Days of Exercise per Week: 6 days    Minutes of Exercise per Session: 40 min  Stress: No Stress Concern Present (07/09/2024)   [employer] of Occupational Health - Occupational Stress Questionnaire    Feeling of Stress: Not at all  Social Connections: Moderately Integrated (07/09/2024)   Social Connection and Isolation Panel    Frequency of Communication with Friends and Family: Twice a week    Frequency of Social Gatherings with Friends and Family: Once a week    Attends Religious Services: More than 4 times per year    Active Member of [employer] or Organizations: No    Attends Engineer, Structural: Not on file    Marital Status: Living with partner  Intimate Partner  Violence: Unknown (07/25/2022)   Received from [facility]   HITS    Physically Hurt: Not on file    Insult or Talk Down To: Not on file    Threaten Physical Harm: Not on file    Scream or Curse: Not on file  Depression (PHQ2-9): Low Risk (07/09/2024)   Depression (PHQ2-9)    PHQ-2 Score: 1  Alcohol Screen: Low Risk (07/09/2024)   Alcohol Screen    Last Alcohol Screening Score (AUDIT): 4  Housing: Low Risk (07/09/2024)   Epic    Unable to Pay for Housing in the Last Year: No    Number of Times Moved in the Last Year: 0    Homeless in the Last Year: No  Utilities: Not on file  Health Literacy: Not on file    Past Surgical History:  Procedure Laterality Date   AUGMENTATION MAMMAPLASTY     BREAST ENHANCEMENT SURGERY  2002   COLONOSCOPY WITH PROPOFOL  N/A 08/19/2022   Procedure: COLONOSCOPY WITH PROPOFOL ;  Surgeon: Unk Corinn Skiff, MD;  Location: [facility] ENDOSCOPY;  Service: Gastroenterology;  Laterality: N/A;   TONSILECTOMY, ADENOIDECTOMY, BILATERAL MYRINGOTOMY AND TUBES      Family History  Problem Relation Age of Onset   Hearing loss Maternal Grandmother    Cancer Maternal Grandmother        Liver cancer   Thyroid  nodules Mother    Cancer Maternal Aunt 2       Breast cancer- died at 71   Breast cancer Maternal Aunt    Breast cancer Cousin     Allergies[1]  Medications Ordered Prior to Encounter[2]  BP 122/76   Pulse 78   Temp 98.1 F (36.7 C) (Oral)   Ht 5' 5 (1.651 m)   Wt 136 lb (61.7 kg)   SpO2 100%   BMI 22.63 kg/m  Objective:   Physical Exam Constitutional:      Appearance: She is not ill-appearing.  HENT:     Right Ear: There is impacted cerumen. Tympanic membrane is not erythematous.     Left Ear: Tympanic membrane and ear canal normal. Tympanic membrane is not erythematous.  Cardiovascular:     Rate and Rhythm: Normal rate and regular rhythm.  Pulmonary:     Effort: Pulmonary effort is normal.     Breath sounds: Normal breath sounds.      Physical Exam        Assessment & Plan:  Ear fullness, bilateral Assessment & Plan: Likely a combination of eustachian tube dysfunction and right cerumen impaction There is no evidence for infection  She will discontinue Sudafed.  Continue Xyzal  5 mg daily, Flonase  daily.  Referral placed to ENT in  case symptoms not improve. Right ear irrigated.  Orders: -     Ambulatory referral to ENT  Impacted cerumen of right ear Assessment & Plan: Unable to tolerate a few seconds of irrigation.  Discussed Debrox drops, avoiding ear buds.     Dysfunction of left eustachian tube Assessment & Plan: Exam today consistent.  Discussed to stop all Sudafed due to risks of rebound symptoms.  Discussed this today Continue Flonas and Zyrtec .   Referral placed to ENT in case symptoms do not improve.  We discussed that symptoms may last for another week or so.      Assessment and Plan Assessment & Plan         Comer MARLA Gaskins, NP       [1] No Known Allergies [2]  Current Outpatient Medications on File Prior to Visit  Medication Sig Dispense Refill   B Complex Vitamins (B COMPLEX PO) Take by mouth.     buPROPion  (WELLBUTRIN ) 75 MG tablet Take 1 tablet (75 mg total) by mouth daily. For anxiety 90 tablet 2   Cholecalciferol (D3 ADULT) 25 MCG (1000 UT) CHEW Chew 1 tablet (1,000 Units total) by mouth daily.     clobetasol  ointment (TEMOVATE ) 0.05 % Apply 1 gram topically to affected area of skin twice daily. Stop once resolved and restart as needed for flares. Avoid use on face, armpits, groin unless otherwise indicated. 60 g 0   COLLAGEN PO Take by mouth.     escitalopram  (LEXAPRO ) 10 MG tablet Take 1 tablet (10 mg total) by mouth daily. For anxiety 90 tablet 1   fluticasone  (FLONASE ) 50 MCG/ACT nasal spray Place 2 sprays into both nostrils daily. 16 g 6   hydrOXYzine  (ATARAX ) 10 MG tablet Take 1 tablet (10 mg total) by mouth 2 (two) times daily as needed for  anxiety. 30 tablet 0   Levonorgestrel-Ethinyl Estradiol (AMETHIA) 0.1-0.02 & 0.01 MG tablet TAKE 1 TABLET BY MOUTH EVERY DAY 91 tablet 3   meloxicam  (MOBIC ) 15 MG tablet Take 1 tablet (15 mg total) by mouth daily as needed for pain. 90 tablet 0   Multiple Vitamins-Minerals (MULTIVITAMIN WITH MINERALS) tablet Take 1 tablet by mouth daily.     TURMERIC PO Take by mouth.     cetirizine  (ZYRTEC  ALLERGY) 10 MG tablet Take 1 tablet (10 mg total) by mouth daily. 90 tablet 1   levocetirizine (XYZAL ) 5 MG tablet Take 1 tablet (5 mg total) by mouth every evening. For allergies 90 tablet 0   No current facility-administered medications on file prior to visit.   "

## 2024-07-20 ENCOUNTER — Encounter: Payer: No Typology Code available for payment source | Admitting: Primary Care

## 2024-07-20 NOTE — Telephone Encounter (Signed)
 Already addressed (see 07/11/24 pt msg).

## 2024-07-20 NOTE — Telephone Encounter (Addendum)
 Spoke with pt about referral. States she has left several voicemails at [facility] but no one is returning her call. She's concerned because now she has loss of hearing in both ears and pressure. Says she will try to call them again but may have to see about being referred to another office. Pt will let us  know. Fyi to [place].

## 2024-07-20 NOTE — Telephone Encounter (Signed)
 Corean, can you help with this ENT referral? I'm concerned about her hearing loss.

## 2024-07-22 ENCOUNTER — Ambulatory Visit (INDEPENDENT_AMBULATORY_CARE_PROVIDER_SITE_OTHER): Admitting: Primary Care

## 2024-07-22 ENCOUNTER — Encounter: Payer: Self-pay | Admitting: Primary Care

## 2024-07-22 VITALS — BP 116/74 | HR 77 | Temp 97.9°F | Ht 64.5 in | Wt 135.2 lb

## 2024-07-22 DIAGNOSIS — Z1231 Encounter for screening mammogram for malignant neoplasm of breast: Secondary | ICD-10-CM | POA: Diagnosis not present

## 2024-07-22 DIAGNOSIS — Z13220 Encounter for screening for lipoid disorders: Secondary | ICD-10-CM

## 2024-07-22 DIAGNOSIS — M2550 Pain in unspecified joint: Secondary | ICD-10-CM

## 2024-07-22 DIAGNOSIS — Z0000 Encounter for general adult medical examination without abnormal findings: Secondary | ICD-10-CM | POA: Diagnosis not present

## 2024-07-22 DIAGNOSIS — H938X3 Other specified disorders of ear, bilateral: Secondary | ICD-10-CM

## 2024-07-22 DIAGNOSIS — J3089 Other allergic rhinitis: Secondary | ICD-10-CM | POA: Diagnosis not present

## 2024-07-22 DIAGNOSIS — H6121 Impacted cerumen, right ear: Secondary | ICD-10-CM

## 2024-07-22 DIAGNOSIS — F411 Generalized anxiety disorder: Secondary | ICD-10-CM | POA: Diagnosis not present

## 2024-07-22 DIAGNOSIS — L290 Pruritus ani: Secondary | ICD-10-CM

## 2024-07-22 NOTE — Progress Notes (Signed)
 "  Subjective:    Patient ID: Mary Mason, female    DOB: 20-May-1971, 54 y.o.   MRN: [id number]  Mary Mason is a very pleasant 54 y.o. female with a history of patient kidney function, cerumen impaction, chilblain lupus, environmental allergies who presents today for complete physical and follow up of chronic conditions.  Immunizations: -Tetanus: Completed in 2019 -Influenza: Completed this season -Shingles: Completed Shingrix series  Diet: Fair diet.  Exercise: No regular exercise.  Eye exam: Completes annually  Dental exam: Completes semi-annually    Pap Smear: Completed in January 2024 Mammogram: Completed in February 2025  Colonoscopy: Completed in 2024, due 2034  BP Readings from Last 3 Encounters:  07/22/24 116/74  07/09/24 122/76  04/19/24 132/80       Review of Systems  Constitutional:  Negative for unexpected weight change.  HENT:  Negative for rhinorrhea.        Ear fullness bilaterally   Respiratory:  Negative for cough and shortness of breath.   Cardiovascular:  Negative for chest pain.  Gastrointestinal:  Negative for constipation and diarrhea.  Genitourinary:  Negative for difficulty urinating.  Musculoskeletal:  Negative for arthralgias and myalgias.  Skin:  Negative for rash.  Allergic/Immunologic: Negative for environmental allergies.  Neurological:  Negative for dizziness and headaches.  Psychiatric/Behavioral:  The patient is not nervous/anxious.          Past Medical History:  Diagnosis Date   Acute non-recurrent maxillary sinusitis 07/31/2022   Arthritis 2023   Ache in hip and hands   Cellulitis    Chilblain lupus erythematosus    Chilblains    Depression    GAD (generalized anxiety disorder)    Vaginal itching 04/19/2024    Social History   Socioeconomic History   Marital status: Single    Spouse name: Not on file   Number of children: Not on file   Years of education: Not on file   Highest education level: Bachelor's  degree (e.g., BA, AB, BS)  Occupational History   Not on file  Tobacco Use   Smoking status: Never   Smokeless tobacco: Never  Vaping Use   Vaping status: Never Used  Substance and Sexual Activity   Alcohol use: Yes    Alcohol/week: 4.0 standard drinks of alcohol    Types: 2 Glasses of wine, 2 Shots of liquor per week    Comment: occasional   Drug use: Never   Sexual activity: Yes    Partners: Male    Birth control/protection: Pill    Comment: Been off since octobee for lab tests  Other Topics Concern   Not on file  Social History Narrative   Divorced   Employed as a magazine features editor   No children.   Enjoys exercising, spending time with friends.   Social Drivers of Health   Tobacco Use: Low Risk (07/22/2024)   Patient History    Smoking Tobacco Use: Never    Smokeless Tobacco Use: Never    Passive Exposure: Not on file  Financial Resource Strain: Low Risk (07/09/2024)   Overall Financial Resource Strain (CARDIA)    Difficulty of Paying Living Expenses: Not very hard  Food Insecurity: No Food Insecurity (07/09/2024)   Epic    Worried About Programme Researcher, Broadcasting/film/video in the Last Year: Never true    Ran Out of Food in the Last Year: Never true  Transportation Needs: No Transportation Needs (07/09/2024)   Epic    Lack of Transportation (Medical):  No    Lack of Transportation (Non-Medical): No  Physical Activity: Sufficiently Active (07/09/2024)   Exercise Vital Sign    Days of Exercise per Week: 6 days    Minutes of Exercise per Session: 40 min  Stress: No Stress Concern Present (07/09/2024)   [employer] of Occupational Health - Occupational Stress Questionnaire    Feeling of Stress: Not at all  Social Connections: Moderately Integrated (07/09/2024)   Social Connection and Isolation Panel    Frequency of Communication with Friends and Family: Twice a week    Frequency of Social Gatherings with Friends and Family: Once a week    Attends Religious Services: More than 4  times per year    Active Member of [employer] or Organizations: No    Attends Engineer, Structural: Not on file    Marital Status: Living with partner  Intimate Partner Violence: Unknown (07/25/2022)   Received from [facility]   HITS    Physically Hurt: Not on file    Insult or Talk Down To: Not on file    Threaten Physical Harm: Not on file    Scream or Curse: Not on file  Depression (PHQ2-9): Low Risk (07/09/2024)   Depression (PHQ2-9)    PHQ-2 Score: 1  Alcohol Screen: Low Risk (07/09/2024)   Alcohol Screen    Last Alcohol Screening Score (AUDIT): 4  Housing: Low Risk (07/09/2024)   Epic    Unable to Pay for Housing in the Last Year: No    Number of Times Moved in the Last Year: 0    Homeless in the Last Year: No  Utilities: Not on file  Health Literacy: Not on file    Past Surgical History:  Procedure Laterality Date   AUGMENTATION MAMMAPLASTY     BREAST ENHANCEMENT SURGERY  2002   COLONOSCOPY WITH PROPOFOL  N/A 08/19/2022   Procedure: COLONOSCOPY WITH PROPOFOL ;  Surgeon: Unk Corinn Skiff, MD;  Location: [facility] ENDOSCOPY;  Service: Gastroenterology;  Laterality: N/A;   COSMETIC SURGERY  2002   Implants   TONSILECTOMY, ADENOIDECTOMY, BILATERAL MYRINGOTOMY AND TUBES      Family History  Problem Relation Age of Onset   Hearing loss Maternal Grandmother    Cancer Maternal Grandmother        Liver cancer   Arthritis Maternal Grandmother    Vision loss Maternal Grandmother    Thyroid  nodules Mother    Hearing loss Mother    Cancer Maternal Aunt 13       Breast cancer- died at 7   Breast cancer Maternal Aunt    Breast cancer Cousin    Heart disease Maternal Grandfather    Hearing loss Brother     Allergies[1]  Medications Ordered Prior to Encounter[2]  BP 116/74   Pulse 77   Temp 97.9 F (36.6 C) (Oral)   Ht 5' 4.5 (1.638 m)   Wt 135 lb 4 oz (61.3 kg)   SpO2 99%   BMI 22.86 kg/m  Objective:   Physical Exam HENT:     Right Ear: Tympanic membrane and  ear canal normal. There is impacted cerumen.     Left Ear: Tympanic membrane and ear canal normal.  Eyes:     Pupils: Pupils are equal, round, and reactive to light.  Cardiovascular:     Rate and Rhythm: Normal rate and regular rhythm.  Pulmonary:     Effort: Pulmonary effort is normal.     Breath sounds: Normal breath sounds.  Abdominal:  General: Bowel sounds are normal.     Palpations: Abdomen is soft.     Tenderness: There is no abdominal tenderness.  Musculoskeletal:        General: Normal range of motion.     Cervical back: Neck supple.  Skin:    General: Skin is warm and dry.  Neurological:     Mental Status: She is alert and oriented to person, place, and time.     Cranial Nerves: No cranial nerve deficit.     Deep Tendon Reflexes:     Reflex Scores:      Patellar reflexes are 2+ on the right side and 2+ on the left side. Psychiatric:        Mood and Affect: Mood normal.     Physical Exam        Assessment & Plan:  Preventative health care Assessment & Plan: Immunizations UTD. Pap smear UTD. Mammogram due, orders placed. Colonoscopy UTD, due 2034  Discussed the importance of a healthy diet and regular exercise in order for weight loss, and to reduce the risk of further co-morbidity.  Exam stable. Labs pending.  Follow up in 1 year for repeat physical.   Orders: -     Basic metabolic panel with GFR -     Lipid panel  Rectal itching Assessment & Plan: Resolved.  Following with GI.   Pain, joint, multiple sites Assessment & Plan: Controlled.  Continue Meloxicam  15 mg PRN.   GAD (generalized anxiety disorder) Assessment & Plan: Controlled.  Continue Lexapro  10 mg daily, Wellbutrin  75 mg daily, hydroxyzine  10 mg PRN.   Environmental and seasonal allergies Assessment & Plan: Stable.  Continue Xyzal  5 mg daily, Flonase .   Ear fullness, bilateral Assessment & Plan: Will be evaluated by ENT tomorrow as scheduled.   Impacted  cerumen of right ear Assessment & Plan: Right cerumen impaction identified on exam. Patient consented to irrigation of canals on the right  Right canals irrigated. Patient tolerated well. TM's and canals post irrigation unremarkable.   Discussed home care instructions.     Screening mammogram for breast cancer -     3D Screening Mammogram, Left and Right; Future    Assessment and Plan Assessment & Plan         Comer MARLA Gaskins, NP       [1] No Known Allergies [2]  Current Outpatient Medications on File Prior to Visit  Medication Sig Dispense Refill   B Complex Vitamins (B COMPLEX PO) Take by mouth.     buPROPion  (WELLBUTRIN ) 75 MG tablet Take 1 tablet (75 mg total) by mouth daily. For anxiety 90 tablet 2   Cholecalciferol (D3 ADULT) 25 MCG (1000 UT) CHEW Chew 1 tablet (1,000 Units total) by mouth daily.     clobetasol  ointment (TEMOVATE ) 0.05 % Apply 1 gram topically to affected area of skin twice daily. Stop once resolved and restart as needed for flares. Avoid use on face, armpits, groin unless otherwise indicated. 60 g 0   COLLAGEN PO Take by mouth.     escitalopram  (LEXAPRO ) 10 MG tablet Take 1 tablet (10 mg total) by mouth daily. For anxiety 90 tablet 1   fluticasone  (FLONASE ) 50 MCG/ACT nasal spray Place 2 sprays into both nostrils daily. 16 g 6   hydrOXYzine  (ATARAX ) 10 MG tablet Take 1 tablet (10 mg total) by mouth 2 (two) times daily as needed for anxiety. 30 tablet 0   Levonorgestrel-Ethinyl Estradiol (AMETHIA) 0.1-0.02 & 0.01 MG tablet Take 1 tablet  by mouth daily. 91 tablet 3   meloxicam  (MOBIC ) 15 MG tablet Take 1 tablet (15 mg total) by mouth daily as needed for pain. 90 tablet 0   Multiple Vitamins-Minerals (MULTIVITAMIN WITH MINERALS) tablet Take 1 tablet by mouth daily.     TURMERIC PO Take by mouth.     levocetirizine (XYZAL ) 5 MG tablet Take 1 tablet (5 mg total) by mouth every evening. For allergies 90 tablet 0   No current  facility-administered medications on file prior to visit.   "

## 2024-07-22 NOTE — Assessment & Plan Note (Signed)
 Will be evaluated by ENT tomorrow as scheduled.

## 2024-07-22 NOTE — Assessment & Plan Note (Signed)
 Immunizations UTD. Pap smear UTD. Mammogram due, orders placed. Colonoscopy UTD, due 2034  Discussed the importance of a healthy diet and regular exercise in order for weight loss, and to reduce the risk of further co-morbidity.  Exam stable. Labs pending.  Follow up in 1 year for repeat physical.

## 2024-07-22 NOTE — Assessment & Plan Note (Signed)
 Stable.  Continue Xyzal  5 mg daily, Flonase .

## 2024-07-22 NOTE — Assessment & Plan Note (Signed)
 Right cerumen impaction identified on exam. Patient consented to irrigation of canals on the right  Right canals irrigated. Patient tolerated well. TM's and canals post irrigation unremarkable.   Discussed home care instructions.

## 2024-07-22 NOTE — Assessment & Plan Note (Signed)
 Controlled.  Continue Lexapro  10 mg daily, Wellbutrin  75 mg daily, hydroxyzine  10 mg PRN.

## 2024-07-22 NOTE — Assessment & Plan Note (Signed)
 Resolved.  Following with GI.

## 2024-07-22 NOTE — Assessment & Plan Note (Signed)
Controlled.  Continue Meloxicam 15 mg PRN.

## 2024-07-22 NOTE — Patient Instructions (Addendum)
 Stop by the lab prior to leaving today. I will notify you of your results once received.   [facility] to schedule your [facility].   It was a pleasure to see you today!

## 2024-07-23 ENCOUNTER — Ambulatory Visit: Payer: Self-pay | Admitting: Primary Care

## 2024-07-23 ENCOUNTER — Other Ambulatory Visit: Payer: Self-pay | Admitting: Primary Care

## 2024-07-23 DIAGNOSIS — F411 Generalized anxiety disorder: Secondary | ICD-10-CM

## 2024-07-23 LAB — BASIC METABOLIC PANEL WITH GFR
BUN: 19 mg/dL (ref 6–23)
CO2: 28 meq/L (ref 19–32)
Calcium: 9.3 mg/dL (ref 8.4–10.5)
Chloride: 103 meq/L (ref 96–112)
Creatinine, Ser: 0.91 mg/dL (ref 0.40–1.20)
GFR: 72.04 mL/min
Glucose, Bld: 76 mg/dL (ref 70–99)
Potassium: 4.5 meq/L (ref 3.5–5.1)
Sodium: 137 meq/L (ref 135–145)

## 2024-07-23 LAB — LIPID PANEL
Cholesterol: 169 mg/dL (ref 28–200)
HDL: 73.7 mg/dL
LDL Cholesterol: 73 mg/dL (ref 10–99)
NonHDL: 95.25
Total CHOL/HDL Ratio: 2
Triglycerides: 109 mg/dL (ref 10.0–149.0)
VLDL: 21.8 mg/dL (ref 0.0–40.0)

## 2024-07-29 DIAGNOSIS — F411 Generalized anxiety disorder: Secondary | ICD-10-CM

## 2024-07-30 MED ORDER — ESCITALOPRAM OXALATE 10 MG PO TABS: 10.0000 mg | ORAL_TABLET | Freq: Every day | ORAL | 2 refills | Status: AC

## 2024-08-17 ENCOUNTER — Encounter

## 2024-08-19 ENCOUNTER — Ambulatory Visit

## 2024-08-30 ENCOUNTER — Ambulatory Visit

## 2024-11-09 ENCOUNTER — Encounter: Admitting: Dermatology

## 2024-11-15 ENCOUNTER — Encounter

## 2025-07-26 ENCOUNTER — Encounter: Admitting: Primary Care
